# Patient Record
Sex: Female | Born: 1935 | Race: Black or African American | Hispanic: No | Smoking: Never smoker
Health system: Southern US, Community
[De-identification: ages and names within clinical notes are randomized; demographics above are authoritative.]

## PROBLEM LIST (undated history)

## (undated) DIAGNOSIS — I10 Essential (primary) hypertension: Secondary | ICD-10-CM

## (undated) DIAGNOSIS — E079 Disorder of thyroid, unspecified: Secondary | ICD-10-CM

---

## 2001-07-19 ENCOUNTER — Other Ambulatory Visit: Admission: RE | Admit: 2001-07-19 | Discharge: 2001-07-19 | Payer: Self-pay | Admitting: Family Medicine

## 2001-07-25 ENCOUNTER — Encounter: Payer: Self-pay | Admitting: Family Medicine

## 2001-07-25 ENCOUNTER — Ambulatory Visit (HOSPITAL_COMMUNITY): Admission: RE | Admit: 2001-07-25 | Discharge: 2001-07-25 | Payer: Self-pay | Admitting: Family Medicine

## 2001-09-30 ENCOUNTER — Ambulatory Visit (HOSPITAL_COMMUNITY): Admission: RE | Admit: 2001-09-30 | Discharge: 2001-09-30 | Payer: Self-pay | Admitting: Family Medicine

## 2001-09-30 ENCOUNTER — Encounter: Payer: Self-pay | Admitting: Family Medicine

## 2002-10-03 ENCOUNTER — Ambulatory Visit (HOSPITAL_COMMUNITY): Admission: RE | Admit: 2002-10-03 | Discharge: 2002-10-03 | Payer: Self-pay | Admitting: Family Medicine

## 2002-10-03 ENCOUNTER — Encounter: Payer: Self-pay | Admitting: Family Medicine

## 2003-01-12 ENCOUNTER — Encounter: Payer: Self-pay | Admitting: Podiatry

## 2003-01-15 ENCOUNTER — Ambulatory Visit (HOSPITAL_COMMUNITY): Admission: RE | Admit: 2003-01-15 | Discharge: 2003-01-15 | Payer: Self-pay | Admitting: Podiatry

## 2003-10-23 ENCOUNTER — Ambulatory Visit (HOSPITAL_COMMUNITY): Admission: RE | Admit: 2003-10-23 | Discharge: 2003-10-23 | Payer: Self-pay | Admitting: Family Medicine

## 2004-12-18 ENCOUNTER — Ambulatory Visit (HOSPITAL_COMMUNITY): Admission: RE | Admit: 2004-12-18 | Discharge: 2004-12-18 | Payer: Self-pay | Admitting: Family Medicine

## 2005-01-16 ENCOUNTER — Other Ambulatory Visit: Admission: RE | Admit: 2005-01-16 | Discharge: 2005-01-16 | Payer: Self-pay | Admitting: Obstetrics & Gynecology

## 2005-01-21 ENCOUNTER — Encounter: Payer: Self-pay | Admitting: Internal Medicine

## 2005-01-21 ENCOUNTER — Ambulatory Visit (HOSPITAL_COMMUNITY): Admission: RE | Admit: 2005-01-21 | Discharge: 2005-01-21 | Payer: Self-pay | Admitting: Internal Medicine

## 2005-01-21 ENCOUNTER — Ambulatory Visit: Payer: Self-pay | Admitting: Internal Medicine

## 2006-01-21 ENCOUNTER — Ambulatory Visit (HOSPITAL_COMMUNITY): Admission: RE | Admit: 2006-01-21 | Discharge: 2006-01-21 | Payer: Self-pay | Admitting: Family Medicine

## 2007-03-08 ENCOUNTER — Ambulatory Visit (HOSPITAL_COMMUNITY): Admission: RE | Admit: 2007-03-08 | Discharge: 2007-03-08 | Payer: Self-pay | Admitting: Family Medicine

## 2007-08-01 ENCOUNTER — Ambulatory Visit (HOSPITAL_COMMUNITY): Admission: RE | Admit: 2007-08-01 | Discharge: 2007-08-01 | Payer: Self-pay | Admitting: Family Medicine

## 2008-03-20 ENCOUNTER — Ambulatory Visit (HOSPITAL_COMMUNITY): Admission: RE | Admit: 2008-03-20 | Discharge: 2008-03-20 | Payer: Self-pay | Admitting: Family Medicine

## 2008-07-06 HISTORY — PX: CHOLECYSTECTOMY: SHX55

## 2009-01-24 ENCOUNTER — Encounter (INDEPENDENT_AMBULATORY_CARE_PROVIDER_SITE_OTHER): Payer: Self-pay | Admitting: General Surgery

## 2009-01-24 ENCOUNTER — Other Ambulatory Visit: Admission: RE | Admit: 2009-01-24 | Discharge: 2009-01-24 | Payer: Self-pay | Admitting: General Surgery

## 2009-02-11 ENCOUNTER — Ambulatory Visit (HOSPITAL_COMMUNITY): Admission: RE | Admit: 2009-02-11 | Discharge: 2009-02-11 | Payer: Self-pay | Admitting: Family Medicine

## 2009-02-14 ENCOUNTER — Other Ambulatory Visit: Admission: RE | Admit: 2009-02-14 | Discharge: 2009-02-14 | Payer: Self-pay | Admitting: General Surgery

## 2009-03-22 ENCOUNTER — Ambulatory Visit (HOSPITAL_COMMUNITY): Admission: RE | Admit: 2009-03-22 | Discharge: 2009-03-22 | Payer: Self-pay | Admitting: Family Medicine

## 2009-04-11 ENCOUNTER — Ambulatory Visit (HOSPITAL_COMMUNITY): Admission: RE | Admit: 2009-04-11 | Discharge: 2009-04-11 | Payer: Self-pay | Admitting: Family Medicine

## 2009-10-14 ENCOUNTER — Ambulatory Visit (HOSPITAL_COMMUNITY): Admission: RE | Admit: 2009-10-14 | Discharge: 2009-10-14 | Payer: Self-pay | Admitting: Family Medicine

## 2009-10-29 ENCOUNTER — Ambulatory Visit (HOSPITAL_COMMUNITY): Admission: RE | Admit: 2009-10-29 | Discharge: 2009-10-29 | Payer: Self-pay | Admitting: General Surgery

## 2009-11-20 ENCOUNTER — Encounter (INDEPENDENT_AMBULATORY_CARE_PROVIDER_SITE_OTHER): Payer: Self-pay | Admitting: General Surgery

## 2009-11-20 ENCOUNTER — Ambulatory Visit (HOSPITAL_COMMUNITY): Admission: RE | Admit: 2009-11-20 | Discharge: 2009-11-21 | Payer: Self-pay | Admitting: General Surgery

## 2010-04-24 ENCOUNTER — Ambulatory Visit (HOSPITAL_COMMUNITY): Admission: RE | Admit: 2010-04-24 | Discharge: 2010-04-24 | Payer: Self-pay | Admitting: Family Medicine

## 2010-07-25 IMAGING — CR DG PELVIS 1-2V
1 series · 1 of 1 positions shown · non-contrast
Comparison: None.

Addendum Begins

Note that there are changes of diffuse idiopathic skeletal
hyperostosis (DISH) involving the inferior aspects of the SI joints
with bony bridging appreciated.
Addendum Ends
CLINICAL DATA: Low back and upper pelvis pain.
PELVIS - 1-2 VIEW

[view not recorded]
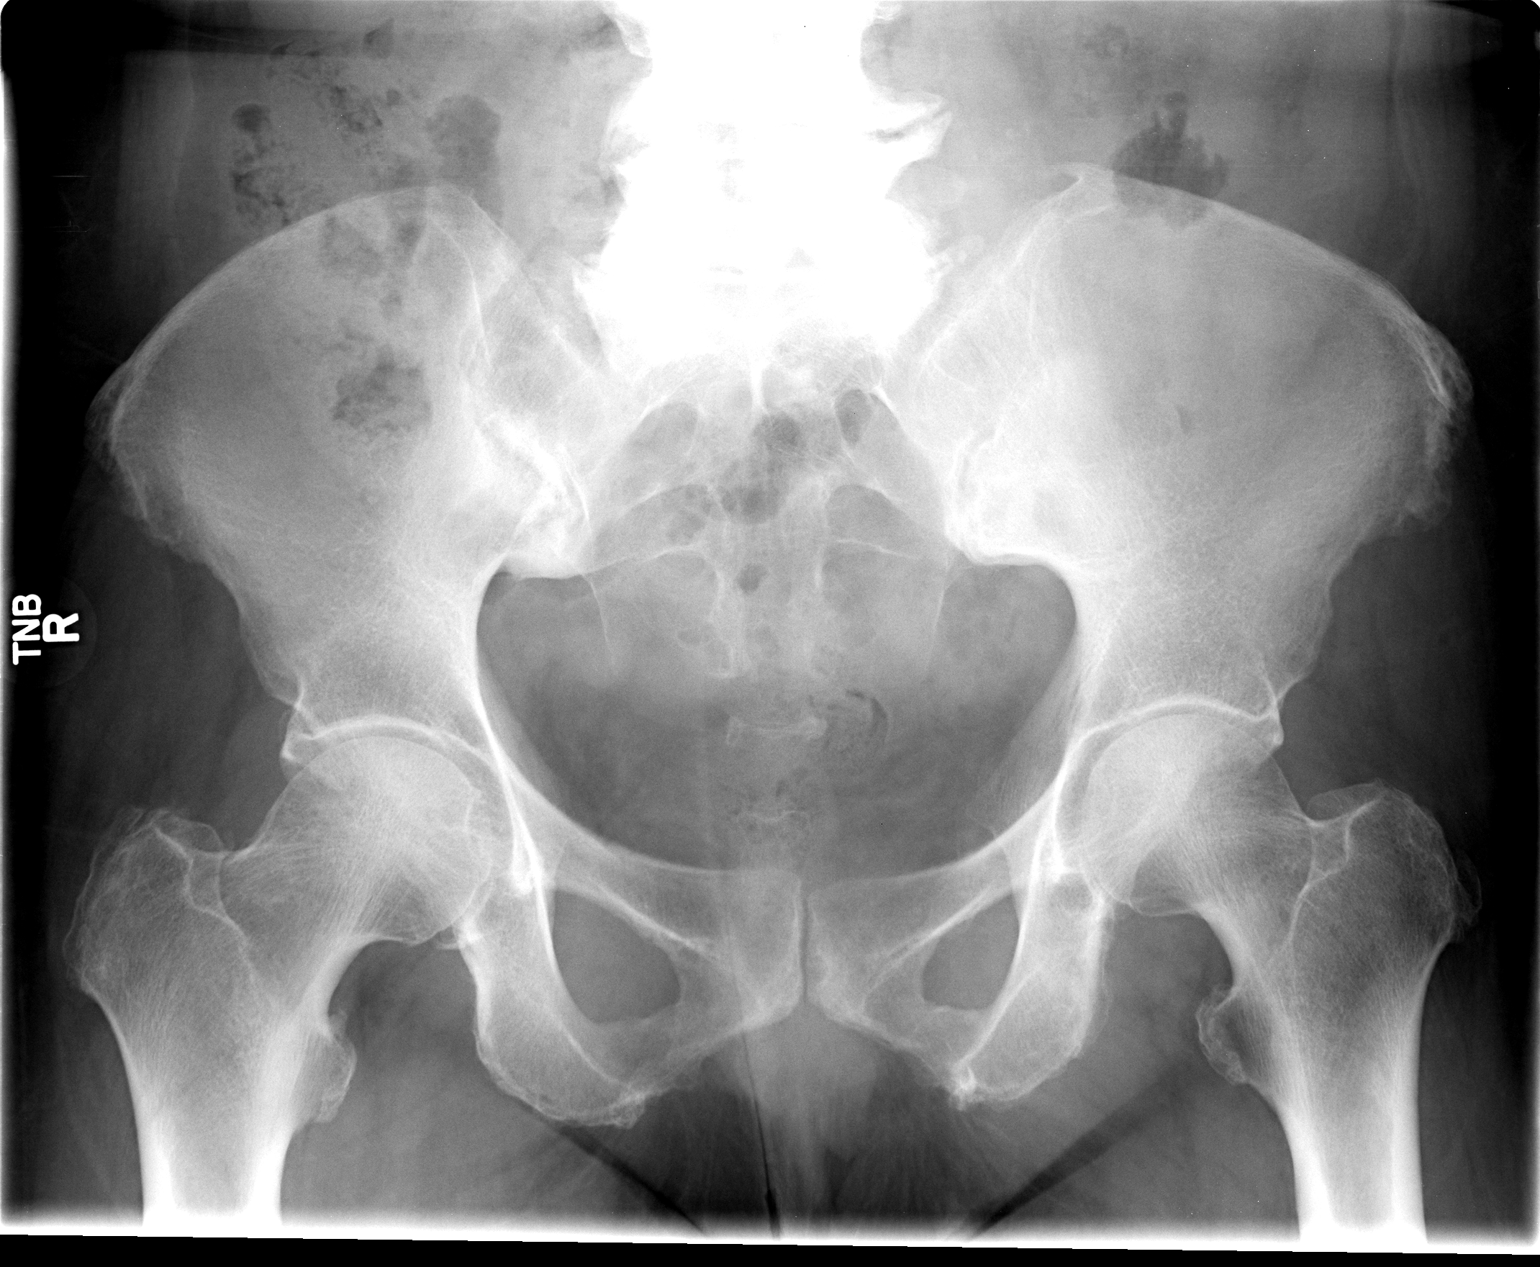

[1 of 1 positions shown; findings below may reference images not displayed]

FINDINGS: Degenerative changes of the SI joints, right greater than
left.  Hip joints unremarkable.  Degenerative changes lower lumbar
spine.
IMPRESSION: Degenerative arthrosis of the sacroiliac joints.  Degenerative
spondylosis.

## 2010-08-09 IMAGING — US US ABDOMEN COMPLETE
1 series · 14 of 25 positions shown · non-contrast
Comparison: None.

CLINICAL DATA: Right upper quadrant pain.  Cholelithiasis.

ABDOMINAL ULTRASOUND COMPLETE

[Series 1: us abdomen complete · 0.26mm/px · 14 of 82 slices shown]
[im 1/82]
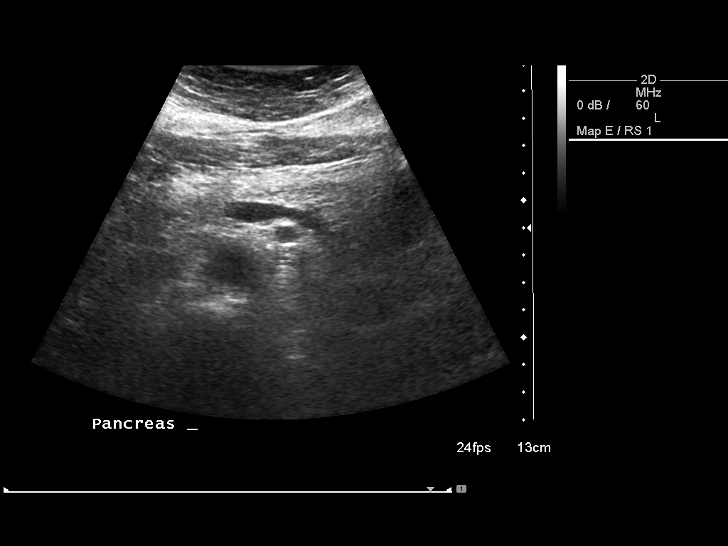
[im 7/82]
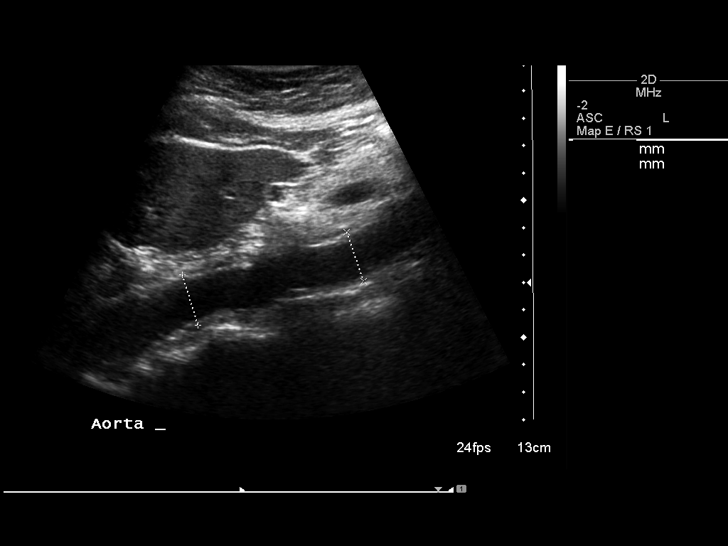
[im 14/82]
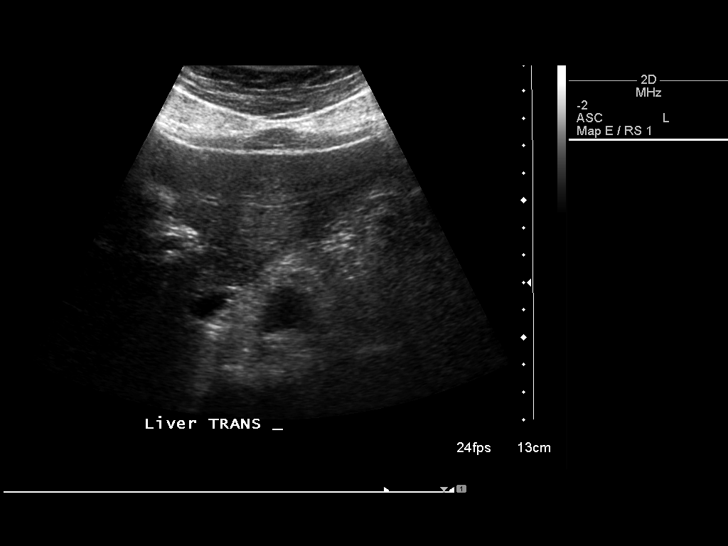
[im 21/82]
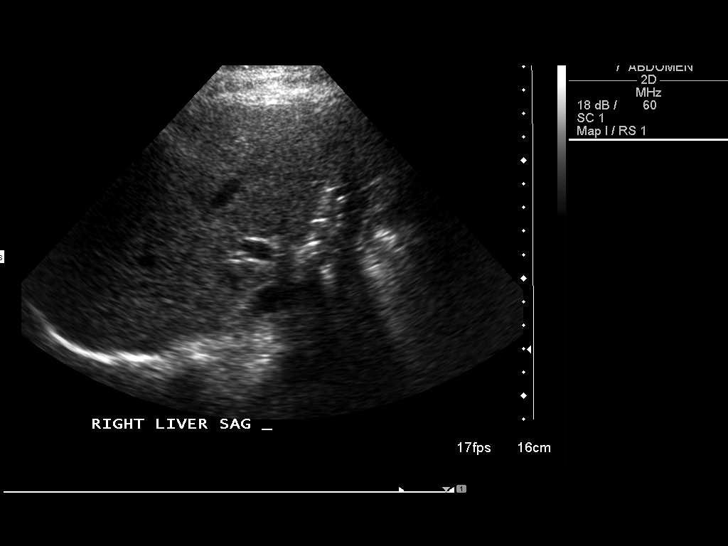
[im 28/82]
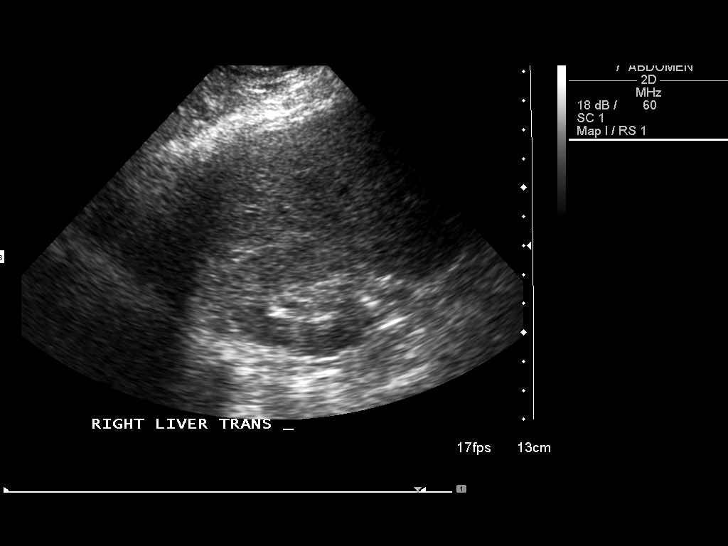
[im 31/82]
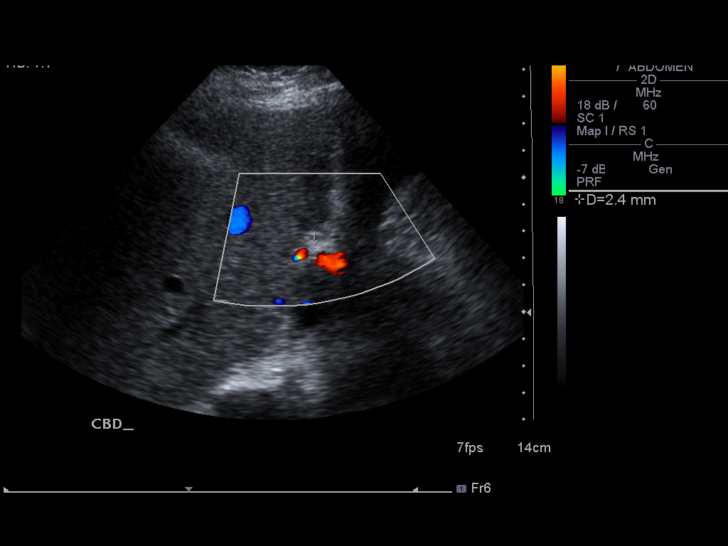
[im 38/82]
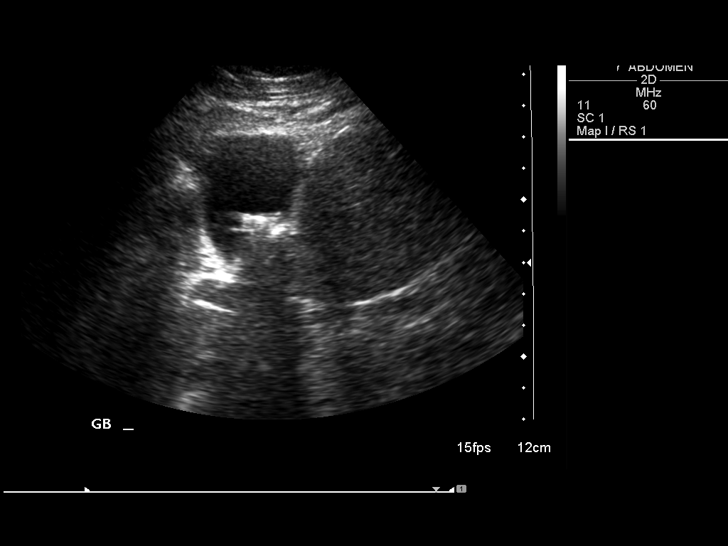
[im 44/82]
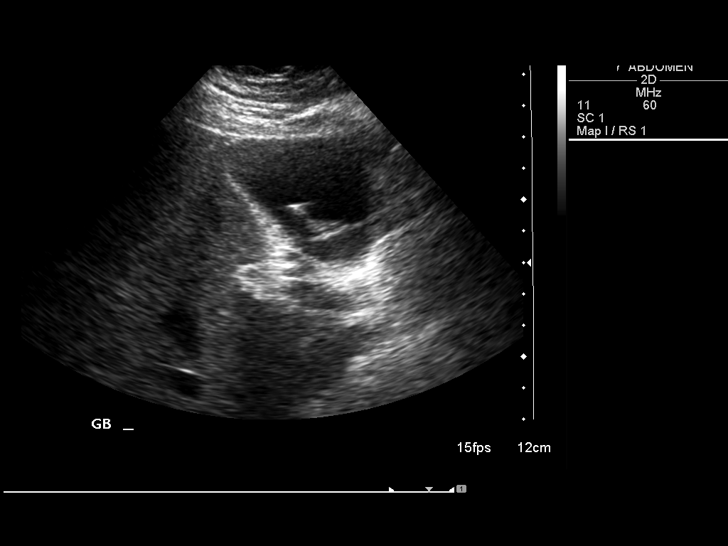
[im 51/82]
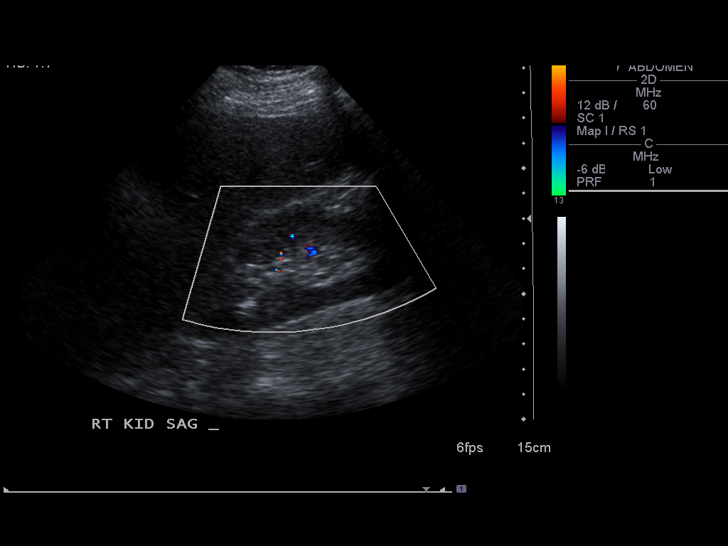
[im 55/82]
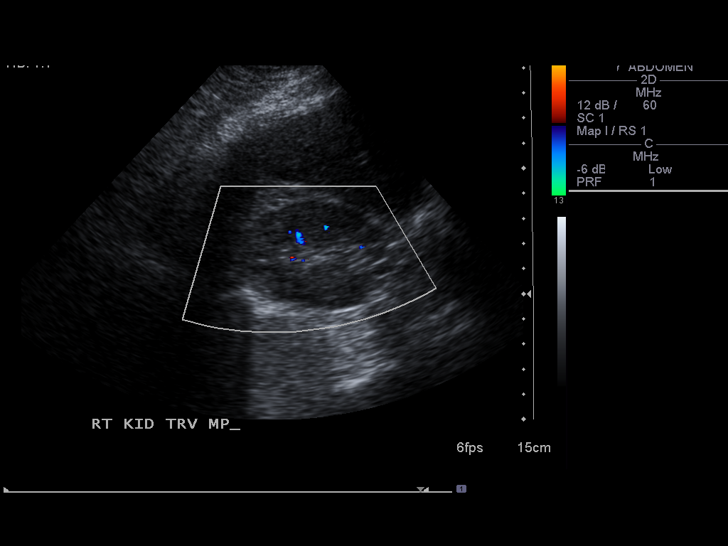
[im 61/82]
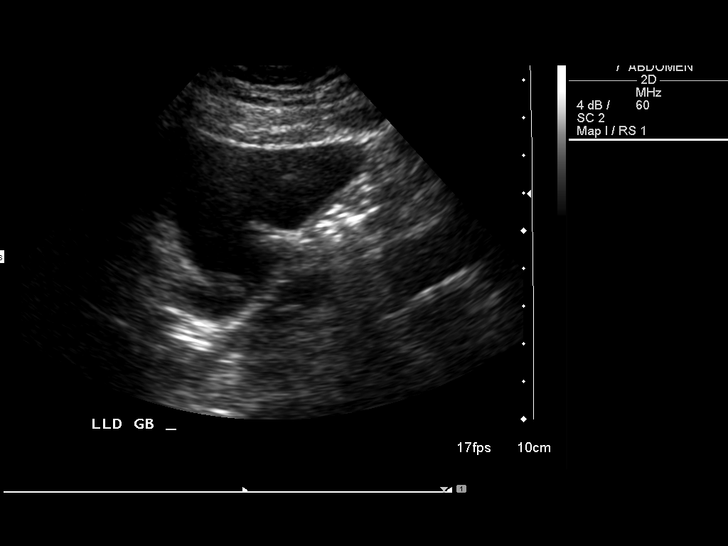
[im 68/82]
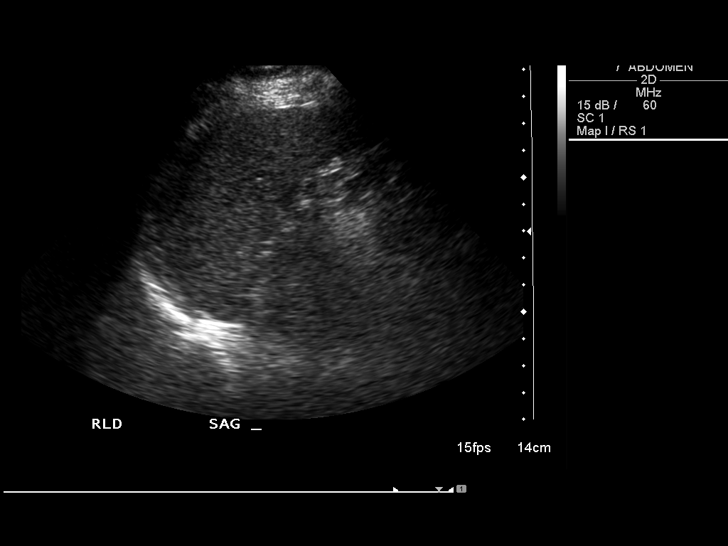
[im 75/82]
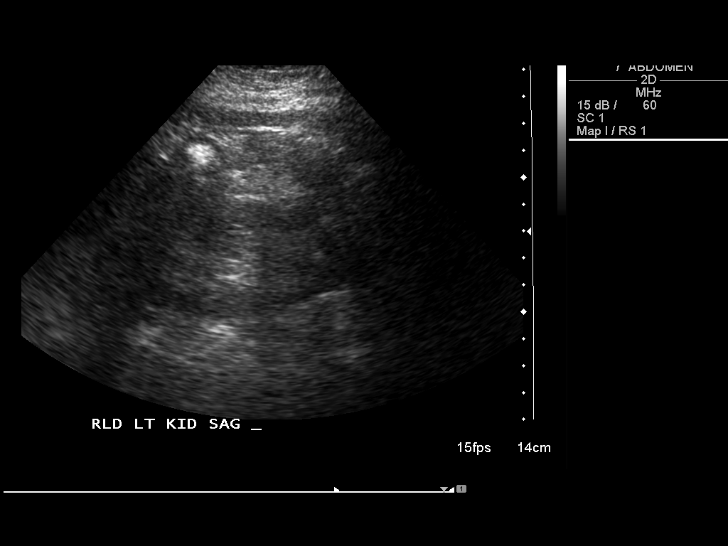
[im 82/82]
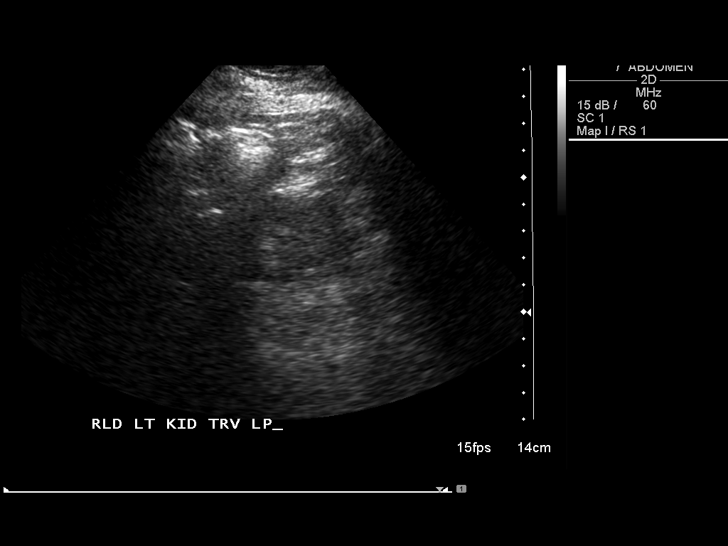

[14 of 25 positions shown; findings below may reference images not displayed]

FINDINGS: Gallbladder:  Multiple gallstones are noted however there is no
evidence of gallbladder dilatation or wall thickening.

Common Bile Duct:  Within normal limits in caliber. Measures 2-3 mm
in diameter.

Liver:  No focal mass lesion identified.  Within normal limits in
parenchymal echogenicity.

IVC:  Appears normal.

Pancreas:  No abnormality identified, although entire pancreas
cannot be visualized by ultrasound.

Spleen:  Within normal limits in size and echotexture.

Right kidney:  Normal in size and parenchymal echogenicity.  No
evidence of mass or hydronephrosis.

Left kidney:  Visualization somewhat limited by body habitus and
bowel gas, however no evidence of mass or hydronephrosis.

Abdominal Aorta:  No aneurysm identified.
IMPRESSION: Cholelithiasis.  No sonographic evidence of acute cholecystitis or
biliary dilatation.

## 2010-09-22 LAB — CBC
MCHC: 34 g/dL (ref 30.0–36.0)
RDW: 13.8 % (ref 11.5–15.5)
WBC: 7 10*3/uL (ref 4.0–10.5)
WBC: 8.7 10*3/uL (ref 4.0–10.5)

## 2010-09-22 LAB — HEPATIC FUNCTION PANEL
ALT: 16 U/L (ref 0–35)
ALT: 21 U/L (ref 0–35)
AST: 22 U/L (ref 0–37)
AST: 24 U/L (ref 0–37)
Albumin: 4 g/dL (ref 3.5–5.2)
Bilirubin, Direct: 0.2 mg/dL (ref 0.0–0.3)
Total Bilirubin: 0.6 mg/dL (ref 0.3–1.2)

## 2010-09-22 LAB — DIFFERENTIAL
Basophils Absolute: 0 10*3/uL (ref 0.0–0.1)
Basophils Absolute: 0 10*3/uL (ref 0.0–0.1)
Basophils Relative: 0 % (ref 0–1)
Basophils Relative: 0 % (ref 0–1)
Eosinophils Absolute: 0.1 10*3/uL (ref 0.0–0.7)
Eosinophils Absolute: 0.2 10*3/uL (ref 0.0–0.7)
Lymphocytes Relative: 17 % (ref 12–46)
Lymphs Abs: 1.1 10*3/uL (ref 0.7–4.0)
Monocytes Absolute: 0.3 10*3/uL (ref 0.1–1.0)
Monocytes Relative: 5 % (ref 3–12)
Monocytes Relative: 7 % (ref 3–12)
Neutro Abs: 5.4 10*3/uL (ref 1.7–7.7)
Neutro Abs: 6.5 10*3/uL (ref 1.7–7.7)
Neutrophils Relative %: 75 % (ref 43–77)
Neutrophils Relative %: 76 % (ref 43–77)

## 2010-09-22 LAB — MRSA PCR SCREENING: MRSA by PCR: NEGATIVE

## 2010-09-22 LAB — BASIC METABOLIC PANEL
CO2: 28 mEq/L (ref 19–32)
CO2: 32 mEq/L (ref 19–32)
Chloride: 103 mEq/L (ref 96–112)
Creatinine, Ser: 1.03 mg/dL (ref 0.4–1.2)
GFR calc Af Amer: >60 mL/min (ref >60)
Glucose, Bld: 87 mg/dL (ref 70–99)
Glucose, Bld: 97 mg/dL (ref 70–99)
Potassium: 4 mEq/L (ref 3.5–5.1)
Potassium: 4.5 mEq/L (ref 3.5–5.1)
Sodium: 139 mEq/L (ref 135–145)

## 2010-11-18 NOTE — Op Note (Signed)
NAME:  Paula Norton, Paula Norton                ACCOUNT NO.:  [id number]   MEDICAL RECORD NO.:  [id number]          PATIENT TYPE:  AMB   LOCATION:  DAY                           FACILITY:  [facility]   PHYSICIAN:  Barbaraann Barthel, M.D. DATE OF BIRTH:  [birth year]   DATE OF PROCEDURE:  01/24/2009  DATE OF DISCHARGE:                               OPERATIVE REPORT   HISTORY OF PRESENT MEDICAL PROBLEM:  In essence, she had a slightly  raised lesion at the inferior portion of the intragluteal space above  the anus and not perianal, but more at the area of the coccyx in the  midline.  This had been present for at least 5 months and that had been  treated with Bactroban for 5 months by Dr. Renard Matter.  This did not  improve and she was referred to my office.   The patient's buttocks were separated and the area was prepped with  Betadine solution and under local anesthesia 1% Xylocaine without  epinephrine.  I anesthetized an area around this and excised this lesion  elliptically and sent this in formalin.  The base of the lesion was then  cauterized and the wound was dressed with a Xeroform dressing and 4x4s.  Followup wound care was prescribed and we will follow up with this  patient in our office.  This lesion will be sent for analysis at [facility].      Barbaraann Barthel, M.D.  Electronically Signed     WB/MEDQ  D:  01/24/2009  T:  01/25/2009  Job:  [id number]   cc:   Angus G. Renard Matter, MD  Fax: [phone]   [facility]OLOGY [facility] AT [facility]

## 2010-11-21 NOTE — Op Note (Signed)
NAME:  Paula Norton, Paula Norton                ACCOUNT NO.:  [id number]   MEDICAL RECORD NO.:  [id number]          PATIENT TYPE:  AMB   LOCATION:  DAY                           FACILITY:  [facility]   PHYSICIAN:  R. Roetta Sessions, M.D. DATE OF BIRTH:  1930-01-31   DATE OF PROCEDURE:  01/21/2005  DATE OF DISCHARGE:                                 OPERATIVE REPORT   PROCEDURE:  Colonoscopy with snare polypectomy.   INDICATIONS FOR PROCEDURE:  The patient is a pleasant 77 year old African-  American female who presents for colorectal screening.  She has never had a  colonoscopy or any other imaging study of her lower GI tract.  She is devoid  of lower GI tract symptoms and there is no family history of colorectal  cancer.  Colonoscopy is now being done and its approach has been discussed  with the patient at length and the potential risks, benefits, and  alternatives have been reviewed and questions answered.  She is agreeable to  proceed and please see documentation in the medical record.   PROCEDURE NOTE:  O2 saturation, pulse, blood pressure, pulses and  respirations were monitored throughout the entire procedure.   CONSCIOUS SEDATION:  Versed 3 mg IV and Demerol 75 mg IV in divided doses.   INSTRUMENT:  Olympus video system.   FINDINGS:  Digital rectal examination revealed no abnormalities.   ENDOSCOPIC FINDINGS:  The prep was adequate.   RECTUM:  Examination of the rectal mucosa, including a retroflex view of the  anal verge revealed no abnormalities.   COLON:  The colonic mucosa was surveyed from the rectosigmoid junction  through the left, transverse and right colon to the appendiceal orifice,  ileocecal valve and cecum.  These structures were well seen and photographed  for the record.  From this level, the scope was slowly withdrawn and all  previously mentioned mucosal surfaces were again seen.  The patient was  noted to have sigmoid diverticula and a 6-mm pedunculated polyp in the mid  ascending colon.  The remainder of the colonic mucosa appeared normal.  The  polyp in the mid ascending colon was removed with cold snare technique and  recovered through the scope.  The patient tolerated the procedure well and  was reacted.   ENDOSCOPIC IMPRESSION:  1.  Normal rectum.  2.  Sigmoid diverticula.  3.  Pedunculated polyp, mid right colon, removed with snare as described      above.  4.  Remainder of colonic mucosa appeared normal.   RECOMMENDATIONS:  1.  No aspirin or __________ medications for the next 10 days.  2.  Diverticulosis literature provided to Ms. Daphine Deutscher.  3.  Follow up on pathology.  4.  Further recommendations to follow.       RMR/MEDQ  D:  01/21/2005  T:  01/21/2005  Job:  [id number]   cc:   Angus G. Renard Matter, MD  [address]  [place]  [place] [address]  Fax: [phone]

## 2010-11-21 NOTE — Op Note (Signed)
NAME:  Paula Norton, Paula Norton                          ACCOUNT NO.:  [id number]   MEDICAL RECORD NO.:  [id number]                   PATIENT TYPE:  AMB   LOCATION:  DAY                                  FACILITY:  [facility]   PHYSICIAN:  Oley Balm. Pricilla Holm, D.P.M.             DATE OF BIRTH:  December 17, 1929   DATE OF PROCEDURE:  01/15/2003  DATE OF DISCHARGE:                                 OPERATIVE REPORT   SURGEON:  Oley Balm. Pricilla Holm, D.P.M.   ANESTHESIA:  Local with standby.   PREOPERATIVE DIAGNOSIS:  Hallux valgus deformity with contracted second,  third, forth, and fifth toes of the left foot.   POSTOPERATIVE DIAGNOSIS:  Hallux valgus deformity with contracted second,  third, forth, and fifth toes of the left foot.   PROCEDURE:  Keller-Austin bunionectomy with IP fusion, second, third, forth,  and fifth toes.   INDICATIONS FOR PROCEDURE:  Longstanding history of pain unrelieved by  conservative care.   DESCRIPTION OF PROCEDURE:  The patient was brought to the operating room and  placed on the operating table in the supine position.  The patient's lower  left foot and leg was then prepped and draped in the usual aseptic manner.  Then, with an ankle tourniquet placed, well-padded to prevent contusion,  elevated to 250 mmHg.  After exsanguination of the left foot, the following  surgical procedure was then performed under monitored anesthesia care.   KELLER-AUSTIN BUNIONECTOMY, LEFT FOOT:  Attention was directed to the medial  aspect of the left foot at the level of the first MTP where a curvilinear  incision was made.  The incision was widened and deepened via sharp and  blunt dissection, being sure to identify and retract all vital structures.  A capsular incision was then made.  It was noted that the patient had  multiple tophaceous deposits throughout the capsule and throughout the first  metatarsal head.  Then utilizing a Zimmer oscillating saw, the medial limits  on the medial aspect of  the first metatarsal head was resected.  Osteotomy  was performed and fixated.  Then the base of the phalanx was then resected,  repositioned, the capsule was interposed into the area, and a [employer] had been performed.  The wound was lavaged with copious amounts  of sterile saline.  The subcutaneous tissue was approximated with sutures of  4-0 Dexon.  The skin was approximated utilizing running subcuticular suture  of 4-0 Dexon.   IP FUSION SECOND TOE, LEFT FOOT:  Attention was directed to the dorsal  aspect of the second toe where two semi-elliptical transverse skin incisions  were made.  The incision was widened and deepened via sharp and blunt  dissection, being sure to identify and retract all vital structures.  The  capsular incision was then made, and the phalanx was freed from the  surrounding soft tissue attachments dorsally, medially, laterally, and  plantarly.  Then  utilizing a Zimmer oscillating saw, the head of the phalanx  was resected.  After resection of the head of the phalanx, the toe was put  in a more anatomical position and fixated utilizing a 0.045 K-wire.  After  fixation, it was noted that the toe was straight and in proper position.  The wound was lavaged with copious amounts of sterile saline, and the  capsule and subcutaneous tissues were approximated utilizing suture of 4-0  Dexon.  The skin was approximated utilizing a horizontal mattress suture of  4-0 Prolene.   IP ARTHROPLASTY WITH IP FUSION, THIRD TOE, LEFT FOOT:  A similar procedure  as described on the fourth toe was performed on the third toe, with no  addition or deletions.   ARTHROPLASTY WITH IP FUSION, FORTH TOE, LEFT FOOT:  A similar procedure as  described on the second toe was performed on the fourth digit, with no  addition or deletions.   ARTHROPLASTY, FIFTH TOE, LEFT FOOT:  A similar procedure as described on the  first toe was performed on the fifth toe, with the exception that  no  fixation was used.   All surgical sites were then infiltrated with approximately one-eighth cc of  dexamethasone phosphate.  A mild compressive bandage consisting of Betadine-  soaked Adaptic, sterile 4 x 4s, and sterile Kling was applied.  The patient  tolerated the procedure well and left the operating room in apparent good  condition with vital signs stable in the recovery room.                                                Oley Balm Pricilla Holm, D.P.M.    DBT/MEDQ  D:  01/15/2003  T:  01/15/2003  Job:  [id number]

## 2010-11-21 NOTE — H&P (Signed)
   NAME:  Paula Norton, Paula Norton                          ACCOUNT NO.:  [id number]   MEDICAL RECORD NO.:  [id number]                   PATIENT TYPE:  AMB   LOCATION:  DAY                                  FACILITY:  [facility]   PHYSICIAN:  Oley Balm. Pricilla Holm, D.P.M.             DATE OF BIRTH:  [birth year]   DATE OF ADMISSION:  01/15/2003  DATE OF DISCHARGE:                                HISTORY & PHYSICAL   HISTORY OF PRESENT ILLNESS:  Ms. Kubin is a 77 year old black female who  presented to the office with chief complaint of rheumatoid arthritis and  multiple deformities of her left foot. Previously, we had corrected a bunion  deformity and done a Hoffman procedure on her right foot. The patient  requests similar procedures done on her left foot. The patient has had a  multiple year history of rheumatoid arthritis with associated deformities.   PAST SURGICAL HISTORY:  Right shoulder was dislocated and additionally, she  had previous foot surgery.   PAST MEDICAL HISTORY:  No transfusion hepatitis risks. Past medical history  of asthma, arthritis, and hypertension.   MEDICATIONS:  Mescaline, Zocor, Clarinex, hydrochlorothiazide, Avapro and  Flonase.   ALLERGIES:  No known drug allergies.   PHYSICAL EXAMINATION:  EXTREMITIES: Lower extremity examination reveals  palpable pedal pulses of DP and PT with spontaneous capillary filling time.  NEUROLOGIC: Examination in essentially within normal limits.  MUSCULOSKELETAL: Examination reveals that the patient has noted hallux  valgus deformity of the left foot with lateral deviation, hallux consists  with hallux valgus deformity. There is also noted significant decreased  range of motion of the first MTP. The patient has dislocation of the left  MTP's with hammer toe deformities 2 through 5 bilaterally.   ASSESSMENT:  Hallux valgus with associated rheumatoid deformities, now  including dislocation of the lesser MTP's.   PLAN:  The patient to undergo  Hoffman procedure with Lorenz Coaster bunionectomy.  Reviewed the procedure with the patient including complications of seizure,  risk of infection, bone infection, postoperative pain, swelling, etc. The  patient seems to understand the same and surgery will be scheduled for January 15, 2003.                                                Oley Balm Pricilla Holm, D.P.M.    DBT/MEDQ  D:  01/14/2003  T:  01/15/2003  Job:  [id number]

## 2011-03-27 ENCOUNTER — Other Ambulatory Visit (HOSPITAL_COMMUNITY): Payer: Self-pay | Admitting: Family Medicine

## 2011-03-27 DIAGNOSIS — Z139 Encounter for screening, unspecified: Secondary | ICD-10-CM

## 2011-04-01 ENCOUNTER — Other Ambulatory Visit (HOSPITAL_COMMUNITY): Payer: Self-pay

## 2011-04-06 ENCOUNTER — Ambulatory Visit (HOSPITAL_COMMUNITY)
Admission: RE | Admit: 2011-04-06 | Discharge: 2011-04-06 | Disposition: A | Discharge status: Home / Self Care | Payer: Medicare Other | Source: Ambulatory Visit | Attending: Family Medicine | Admitting: Family Medicine

## 2011-04-06 DIAGNOSIS — M949 Disorder of cartilage, unspecified: Secondary | ICD-10-CM | POA: Insufficient documentation

## 2011-04-06 DIAGNOSIS — Z780 Asymptomatic menopausal state: Secondary | ICD-10-CM | POA: Insufficient documentation

## 2011-04-06 DIAGNOSIS — M899 Disorder of bone, unspecified: Secondary | ICD-10-CM | POA: Insufficient documentation

## 2011-04-27 ENCOUNTER — Ambulatory Visit (HOSPITAL_COMMUNITY)
Admission: RE | Admit: 2011-04-27 | Discharge: 2011-04-27 | Disposition: A | Discharge status: Home / Self Care | Payer: Medicare Other | Source: Ambulatory Visit | Attending: Family Medicine | Admitting: Family Medicine

## 2011-04-27 DIAGNOSIS — Z139 Encounter for screening, unspecified: Secondary | ICD-10-CM

## 2011-04-27 DIAGNOSIS — Z1231 Encounter for screening mammogram for malignant neoplasm of breast: Secondary | ICD-10-CM | POA: Insufficient documentation

## 2012-05-02 ENCOUNTER — Other Ambulatory Visit (HOSPITAL_COMMUNITY): Payer: Self-pay | Admitting: Family Medicine

## 2012-05-02 DIAGNOSIS — Z139 Encounter for screening, unspecified: Secondary | ICD-10-CM

## 2012-05-09 ENCOUNTER — Ambulatory Visit (HOSPITAL_COMMUNITY)
Admission: RE | Admit: 2012-05-09 | Discharge: 2012-05-09 | Disposition: A | Discharge status: Home / Self Care | Payer: Medicare Other | Source: Ambulatory Visit | Attending: Family Medicine | Admitting: Family Medicine

## 2012-05-09 DIAGNOSIS — Z1231 Encounter for screening mammogram for malignant neoplasm of breast: Secondary | ICD-10-CM | POA: Insufficient documentation

## 2012-05-09 DIAGNOSIS — Z139 Encounter for screening, unspecified: Secondary | ICD-10-CM

## 2012-05-16 ENCOUNTER — Other Ambulatory Visit: Payer: Self-pay | Admitting: Family Medicine

## 2012-05-16 DIAGNOSIS — R928 Other abnormal and inconclusive findings on diagnostic imaging of breast: Secondary | ICD-10-CM

## 2012-05-25 ENCOUNTER — Ambulatory Visit (HOSPITAL_COMMUNITY)
Admission: RE | Admit: 2012-05-25 | Discharge: 2012-05-25 | Disposition: A | Discharge status: Home / Self Care | Payer: Medicare Other | Source: Ambulatory Visit | Attending: Family Medicine | Admitting: Family Medicine

## 2012-05-25 DIAGNOSIS — R928 Other abnormal and inconclusive findings on diagnostic imaging of breast: Secondary | ICD-10-CM

## 2012-06-01 ENCOUNTER — Encounter (HOSPITAL_COMMUNITY): Payer: Medicare Other

## 2012-06-08 ENCOUNTER — Ambulatory Visit (HOSPITAL_COMMUNITY)
Admission: RE | Admit: 2012-06-08 | Discharge: 2012-06-08 | Disposition: A | Discharge status: Home / Self Care | Payer: Medicare Other | Source: Ambulatory Visit | Attending: Family Medicine | Admitting: Family Medicine

## 2012-06-08 DIAGNOSIS — R928 Other abnormal and inconclusive findings on diagnostic imaging of breast: Secondary | ICD-10-CM | POA: Insufficient documentation

## 2012-10-11 ENCOUNTER — Ambulatory Visit (HOSPITAL_COMMUNITY)
Admission: RE | Admit: 2012-10-11 | Discharge: 2012-10-11 | Disposition: A | Discharge status: Home / Self Care | Payer: Medicare Other | Source: Ambulatory Visit | Attending: Family Medicine | Admitting: Family Medicine

## 2012-10-11 ENCOUNTER — Other Ambulatory Visit (HOSPITAL_COMMUNITY): Payer: Self-pay | Admitting: Family Medicine

## 2012-10-11 DIAGNOSIS — M25661 Stiffness of right knee, not elsewhere classified: Secondary | ICD-10-CM

## 2012-10-11 DIAGNOSIS — M25662 Stiffness of left knee, not elsewhere classified: Secondary | ICD-10-CM

## 2012-10-11 DIAGNOSIS — M25561 Pain in right knee: Secondary | ICD-10-CM

## 2012-10-11 DIAGNOSIS — M25562 Pain in left knee: Secondary | ICD-10-CM

## 2012-10-11 DIAGNOSIS — M25569 Pain in unspecified knee: Secondary | ICD-10-CM | POA: Insufficient documentation

## 2012-11-24 ENCOUNTER — Encounter: Payer: Medicare Other | Admitting: Vascular Surgery

## 2013-01-03 ENCOUNTER — Encounter: Payer: Self-pay | Admitting: Vascular Surgery

## 2013-01-04 ENCOUNTER — Ambulatory Visit (INDEPENDENT_AMBULATORY_CARE_PROVIDER_SITE_OTHER): Payer: Medicare Other | Admitting: Vascular Surgery

## 2013-01-04 ENCOUNTER — Encounter (INDEPENDENT_AMBULATORY_CARE_PROVIDER_SITE_OTHER): Payer: Medicare Other | Admitting: *Deleted

## 2013-01-04 ENCOUNTER — Encounter: Payer: Self-pay | Admitting: Vascular Surgery

## 2013-01-04 VITALS — BP 152/81 | HR 77 | Resp 16 | Ht 71.0 in | Wt 168.0 lb

## 2013-01-04 DIAGNOSIS — I83893 Varicose veins of bilateral lower extremities with other complications: Secondary | ICD-10-CM

## 2013-01-04 DIAGNOSIS — R208 Other disturbances of skin sensation: Secondary | ICD-10-CM

## 2013-01-04 DIAGNOSIS — R209 Unspecified disturbances of skin sensation: Secondary | ICD-10-CM

## 2013-01-04 DIAGNOSIS — R0989 Other specified symptoms and signs involving the circulatory and respiratory systems: Secondary | ICD-10-CM

## 2013-01-04 NOTE — Assessment & Plan Note (Signed)
Patient has some small varicose veins bilaterally and also hyperpigmentation and healed ulcers of both legs consistent with chronic venous insufficiency. She does have some reflux in the right saphenofemoral junction and proximal right greater saphenous vein. There is also some reflux in the left saphenofemoral junction. She's had previous sclerotherapy and likely has had vein stripped from both legs based on her duplex. I simply recommended intermittent leg elevation we have discussed the proper positioning for this. Also discussed the importance of compression therapy and she states that she are has compression stockings. She knows to keep the skin well-lubricated to prevent further ulcers and to stay as active as possible. I'll be happy to see her back at any time if any new vascular issue arise.

## 2013-01-04 NOTE — Assessment & Plan Note (Signed)
This patient has a right carotid bruit. This is associated with a carotid stenosis approximately 30% of the time. I have recommended a duplex of her carotids however she would prefer to discuss this with Dr. Renard Matter and in then we'll have him arrange for a carotid duplex scan. She is asymptomatic.

## 2013-01-04 NOTE — Progress Notes (Signed)
[facility] [place]  Patient name: Paula Norton MRN: [id number] DOB: 10/21/1929 Sex: female  REASON FOR CONSULT: bilateral lower extremity varicose veins.  HPI: Paula Norton is a 79 y.o. female who is had previous sclerotherapy in both lower extremities approximately 10 years ago. She had developed some recurrent varicose veins and wished to have these checked out. He states that they are asymptomatic. She denies any significant aching pain and heaviness or or throbbing pain in her lower extremities. She is unaware of any previous history of DVT or phlebitis. She states that she has not had any bleeding episodes associated with her varicose veins.  She states that she does intermittently wear compression stockings.  FAMILY HISTORY:  She is unaware of any history of premature cardiovascular disease to  Family History  Problem Relation Age of Onset  . Hypertension Mother   . Cancer Sister   . Heart disease Brother   . Cancer Brother    SOCIAL HISTORY: History  Substance Use Topics  . Smoking status: Never Smoker   . Smokeless tobacco: Never Used  . Alcohol Use: No   Not on File  Current Outpatient Prescriptions  Medication Sig Dispense Refill  . allopurinol (ZYLOPRIM) 100 MG tablet daily.      Marland Kitchen aspirin 81 MG tablet Take 81 mg by mouth daily.      . diazepam (VALIUM) 5 MG tablet Take 5 mg by mouth every 4 (four) hours as needed for anxiety.      Marland Kitchen DIOVAN 80 MG tablet daily.      Marland Kitchen levothyroxine (SYNTHROID, LEVOTHROID) 75 MCG tablet daily.      . niacin (NIASPAN) 500 MG CR tablet at bedtime.      . simvastatin (ZOCOR) 40 MG tablet daily.       No current facility-administered medications for this visit.   REVIEW OF SYSTEMS: Arly.Keller ] denotes positive finding; [  ] denotes negative finding  CARDIOVASCULAR:  [ ]  chest pain   [ ]  chest pressure   [ ]  palpitations   [ ]  orthopnea   [ ]  dyspnea on exertion   [ ]  claudication   [ ]  rest pain   [ ]  DVT   [ ]   phlebitis PULMONARY:   [ ]  productive cough   [ ]  asthma   [ ]  wheezing NEUROLOGIC:   [ ]  weakness  [ ]  paresthesias  [ ]  aphasia  [ ]  amaurosis  [ ]  dizziness HEMATOLOGIC:   [ ]  bleeding problems   [ ]  clotting disorders MUSCULOSKELETAL:  [ ]  joint pain   [ ]  joint swelling Arly.Keller ] leg swelling GASTROINTESTINAL: [ ]   blood in stool  [ ]   hematemesis GENITOURINARY:  [ ]   dysuria  [ ]   hematuria PSYCHIATRIC:  [ ]  history of major depression INTEGUMENTARY:  [ ]  rashes  [ ]  ulcers CONSTITUTIONAL:  [ ]  fever   [ ]  chills  PHYSICAL EXAM: Filed Vitals:   01/04/13 1104  BP: 152/81  Pulse: 77  Resp: 16  Height: 5\' 11"  (1.803 m)  Weight: 168 lb (76.204 kg)  SpO2: 100%   Body mass index is 23.44 kg/(m^2). GENERAL: The patient is a well-nourished female, in no acute distress. The vital signs are documented above. CARDIOVASCULAR: There is a regular rate and rhythm. She does have a right carotid bruit. He has palpable femoral pulses and pedal pulses bilaterally. PULMONARY: There is good air exchange bilaterally without wheezing or rales. ABDOMEN: Soft  and non-tender with normal pitched bowel sounds.  MUSCULOSKELETAL: There are no major deformities or cyanosis. NEUROLOGIC: No focal weakness or paresthesias are detected. SKIN: There are no ulcers or rashes noted. She has hyperpigmentation of both lower extremities. She has varicose veins of both lower extremities. She does have some healed venous ulcers the medial aspect of both legs. PSYCHIATRIC: The patient has a normal affect.  DATA:  I have independently interpreted her venous duplex scan. She has no evidence of DVT bilaterally. She does have some reflux the right saphenofemoral junction and proximal right greater saphenous vein. On the left side she has some saphenofemoral junction reflux the greater saphenous vein cannot be visualized otherwise.  MEDICAL ISSUES:  Right carotid bruit This patient has a right carotid bruit. This is associated  with a carotid stenosis approximately 30% of the time. I have recommended a duplex of her carotids however she would prefer to discuss this with Dr. Renard Matter and in then we'll have him arrange for a carotid duplex scan. She is asymptomatic.  Varicose veins of lower extremities with other complications Patient has some small varicose veins bilaterally and also hyperpigmentation and healed ulcers of both legs consistent with chronic venous insufficiency. She does have some reflux in the right saphenofemoral junction and proximal right greater saphenous vein. There is also some reflux in the left saphenofemoral junction. She's had previous sclerotherapy and likely has had vein stripped from both legs based on her duplex. I simply recommended intermittent leg elevation we have discussed the proper positioning for this. Also discussed the importance of compression therapy and she states that she are has compression stockings. She knows to keep the skin well-lubricated to prevent further ulcers and to stay as active as possible. I'll be happy to see her back at any time if any new vascular issue arise.   DICKSON,CHRISTOPHER S [facility] [place] [facility]: [phone]

## 2013-01-11 ENCOUNTER — Other Ambulatory Visit: Payer: Self-pay | Admitting: *Deleted

## 2013-01-11 DIAGNOSIS — I83893 Varicose veins of bilateral lower extremities with other complications: Secondary | ICD-10-CM

## 2013-05-03 ENCOUNTER — Telehealth: Payer: Self-pay | Admitting: *Deleted

## 2013-05-03 ENCOUNTER — Other Ambulatory Visit: Payer: Self-pay | Admitting: *Deleted

## 2013-05-03 ENCOUNTER — Ambulatory Visit (INDEPENDENT_AMBULATORY_CARE_PROVIDER_SITE_OTHER): Payer: Medicare Other | Admitting: Orthopedic Surgery

## 2013-05-03 ENCOUNTER — Encounter: Payer: Self-pay | Admitting: Orthopedic Surgery

## 2013-05-03 VITALS — BP 123/86 | Ht 71.0 in | Wt 161.0 lb

## 2013-05-03 DIAGNOSIS — R29898 Other symptoms and signs involving the musculoskeletal system: Secondary | ICD-10-CM | POA: Insufficient documentation

## 2013-05-03 DIAGNOSIS — R269 Unspecified abnormalities of gait and mobility: Secondary | ICD-10-CM

## 2013-05-03 NOTE — Patient Instructions (Signed)
Make referral to neurologist first available  For: bilateral leg weakness and Staggering

## 2013-05-03 NOTE — Progress Notes (Signed)
Patient ID: Paula Norton, female   DOB: 15-Oct-1929, 79 y.o.   MRN: [id number]  Chief Complaint  Patient presents with  . Knee Pain    Bilateral knee weakness and giving out, had fall about 10 days ago    This is an 79 year old female presents with weakness of her legs stumbling falling and gait disturbance which is present for the last 6 months unresponsive to injection and Celebrex  Bilateral knee x-rays have been done and show arthritis of the knees.  She has a review of systems positive for unexpected weight loss and blurred vision with stiffness of the musculoskeletal system and nervousness. Remaining systems were negative.  Her vital signs are stable  Her medical problems are hypertension and hypothyroidism  Current medications niacin valsartan and diazepam allopurinol levothyroxine and simvastatin  She has a family history of heart disease and arthritis Associates to notable for being widowed and she does not smoke or drink  BP 123/86  Ht 5\' 11"  (1.803 m)  Wt 161 lb (73.029 kg)  BMI 22.46 kg/m2  She is oriented x3 her speech is slow her mood is flat her gait is supported by a cane overall appearance is normal  Her legs show no atrophy she has 115 of flexion in the right knee and 125 on the left no pain with range of motion mild crepitance skin is intact. Lower ankle skin shows venous stasis chronic changes with discoloration and shiny appearance  Reflexes are 2+ and intact. No lymphadenopathy was noted.  Upper extremity exam  The right and left upper extremity:   Inspection and palpation revealed no abnormalities in the upper extremities.   Range of motion is full without contracture.  Motor exam is normal with grade 5 strength.  The joints are fully reduced without subluxation.  There is no atrophy or tremor and muscle tone is normal.  All joints are stable.  X-rays were done at the hospital again she has bilateral knee arthritis  Encounter Diagnoses  Name  Primary?  . Weakness of both legs Yes  . Gait disturbance      However dosing she needs surgery in her knee pain is not in any way significant infectious that she doesn't have knee pain  I think she should be evaluated by a neurologist to be worked up for gait disturbance and weakness of the legs.

## 2013-05-03 NOTE — Telephone Encounter (Addendum)
Referral and office notes faxed to Dr. [place]onquah. Awaiting appointment. 

## 2013-05-10 NOTE — Telephone Encounter (Signed)
Appointment with Dr. Gerilyn Pilgrim 06/26/13 at 11:00 am. Patient aware.

## 2013-07-23 ENCOUNTER — Encounter (HOSPITAL_COMMUNITY): Payer: Self-pay | Admitting: Emergency Medicine

## 2013-07-23 ENCOUNTER — Emergency Department (HOSPITAL_COMMUNITY)
Admission: EM | Admit: 2013-07-23 | Discharge: 2013-07-23 | Disposition: A | Discharge status: Home / Self Care | Payer: Medicare Other | Attending: Emergency Medicine | Admitting: Emergency Medicine

## 2013-07-23 ENCOUNTER — Emergency Department (HOSPITAL_COMMUNITY): Payer: Medicare Other

## 2013-07-23 DIAGNOSIS — N390 Urinary tract infection, site not specified: Secondary | ICD-10-CM

## 2013-07-23 DIAGNOSIS — I10 Essential (primary) hypertension: Secondary | ICD-10-CM | POA: Insufficient documentation

## 2013-07-23 DIAGNOSIS — Z79899 Other long term (current) drug therapy: Secondary | ICD-10-CM | POA: Insufficient documentation

## 2013-07-23 DIAGNOSIS — M25519 Pain in unspecified shoulder: Secondary | ICD-10-CM | POA: Insufficient documentation

## 2013-07-23 DIAGNOSIS — Z7982 Long term (current) use of aspirin: Secondary | ICD-10-CM | POA: Insufficient documentation

## 2013-07-23 DIAGNOSIS — E079 Disorder of thyroid, unspecified: Secondary | ICD-10-CM | POA: Insufficient documentation

## 2013-07-23 DIAGNOSIS — M25569 Pain in unspecified knee: Secondary | ICD-10-CM | POA: Insufficient documentation

## 2013-07-23 HISTORY — DX: Disorder of thyroid, unspecified: E07.9

## 2013-07-23 HISTORY — DX: Essential (primary) hypertension: I10

## 2013-07-23 LAB — CBC WITH DIFFERENTIAL/PLATELET
BASOS ABS: 0 10*3/uL (ref 0.0–0.1)
Basophils Relative: 0 % (ref 0–1)
EOS PCT: 1 % (ref 0–5)
Eosinophils Absolute: 0.1 10*3/uL (ref 0.0–0.7)
HCT: 30.9 % — ABNORMAL LOW (ref 36.0–46.0)
Hemoglobin: 10.6 g/dL — ABNORMAL LOW (ref 12.0–15.0)
LYMPHS ABS: 1 10*3/uL (ref 0.7–4.0)
LYMPHS PCT: 16 % (ref 12–46)
MCH: 38.7 pg — ABNORMAL HIGH (ref 26.0–34.0)
MCHC: 34.3 g/dL (ref 30.0–36.0)
MCV: 112.8 fL — AB (ref 78.0–100.0)
MONOS PCT: 4 % (ref 3–12)
Monocytes Absolute: 0.2 10*3/uL (ref 0.1–1.0)
NEUTROS PCT: 79 % — AB (ref 43–77)
Neutro Abs: 4.7 10*3/uL (ref 1.7–7.7)
PLATELETS: 174 10*3/uL (ref 150–400)
RBC: 2.74 MIL/uL — AB (ref 3.87–5.11)
RDW: 14.8 % (ref 11.5–15.5)
WBC: 6 10*3/uL (ref 4.0–10.5)

## 2013-07-23 LAB — BASIC METABOLIC PANEL
BUN: 23 mg/dL (ref 6–23)
CALCIUM: 8.9 mg/dL (ref 8.4–10.5)
CO2: 28 meq/L (ref 19–32)
Chloride: 104 mEq/L (ref 96–112)
Creatinine, Ser: 1.09 mg/dL (ref 0.50–1.10)
GFR calc non Af Amer: 46 mL/min — ABNORMAL LOW (ref >90)
GFR, EST AFRICAN AMERICAN: 54 mL/min — AB (ref >90)
Glucose, Bld: 94 mg/dL (ref 70–99)
Potassium: 4.7 mEq/L (ref 3.7–5.3)
SODIUM: 143 meq/L (ref 137–147)

## 2013-07-23 LAB — URINE MICROSCOPIC-ADD ON

## 2013-07-23 LAB — URINALYSIS, ROUTINE W REFLEX MICROSCOPIC
Bilirubin Urine: NEGATIVE
Glucose, UA: NEGATIVE mg/dL
NITRITE: NEGATIVE
PROTEIN: 30 mg/dL — AB
SPECIFIC GRAVITY, URINE: 1.025 (ref 1.005–1.030)
UROBILINOGEN UA: 1 mg/dL (ref 0.0–1.0)
pH: 6 (ref 5.0–8.0)

## 2013-07-23 LAB — GLUCOSE, CAPILLARY: GLUCOSE-CAPILLARY: 71 mg/dL (ref 70–99)

## 2013-07-23 LAB — TROPONIN I

## 2013-07-23 MED ORDER — SODIUM CHLORIDE 0.9 % IV SOLN: INTRAVENOUS | Status: DC

## 2013-07-23 MED ORDER — DEXTROSE 5 % IV SOLN
1.0000 g | Freq: Once | INTRAVENOUS | Status: AC
  Administered 2013-07-23: 1 g via INTRAVENOUS
  Filled 2013-07-23: qty 10

## 2013-07-23 MED ORDER — SODIUM CHLORIDE 0.9 % IV BOLUS (SEPSIS)
250.0000 mL | Freq: Once | INTRAVENOUS | Status: AC
  Administered 2013-07-23: 250 mL via INTRAVENOUS

## 2013-07-23 MED ORDER — CEPHALEXIN 500 MG PO CAPS: 500.0000 mg | ORAL_CAPSULE | Freq: Four times a day (QID) | ORAL | Status: DC

## 2013-07-23 NOTE — ED Provider Notes (Signed)
CSN: [id number]     Arrival date & time 07/23/13  1334 History   This chart was scribed for Shelda Jakes, MD, by Yevette Edwards, ED Scribe. This patient was seen in room APA05/APA05 and the patient's care was started at 2:38 PM.  First MD Initiated Contact with Patient 07/23/13 1408     Chief Complaint  Patient presents with  . Near Syncope   Patient is a 80 y.o. female presenting with near-syncope. The history is provided by the patient and a relative. No language interpreter was used.  Near Syncope This is a new problem. The current episode started 3 to 5 hours ago. The problem occurs rarely. The problem has been gradually improving. Pertinent negatives include no chest pain, no abdominal pain, no headaches and no shortness of breath. Nothing aggravates the symptoms. Nothing relieves the symptoms. She has tried nothing for the symptoms.   HPI Comments: ERRIN WHITELAW is a 80 y.o. female, with a h/o HTN, who was brought to the ED via EMS due to a near-syncopal episode which occurred at church three hours ago.  The pt was treated by EMS with sugar, and they measured her CBG at 77. The pt reports she believes she fell asleep stating the church was very warm. Her niece, who witnessed the event, reports the pt appeared somnolent and confused. She denies any pain, visual changes, and a headache. The pt is a non-smoker.   Past Medical History  Diagnosis Date  . Hypertension   . Thyroid disease    Past Surgical History  Procedure Laterality Date  . Cholecystectomy  2010    Gall Bladder   Family History  Problem Relation Age of Onset  . Hypertension Mother   . Cancer Sister   . Heart disease Brother   . Cancer Brother    History  Substance Use Topics  . Smoking status: Never Smoker   . Smokeless tobacco: Never Used  . Alcohol Use: No   No OB history provided.  Review of Systems  Constitutional: Negative for fever.  HENT: Negative for congestion, rhinorrhea and sore throat.    Eyes: Negative for visual disturbance.  Respiratory: Negative for cough and shortness of breath.   Cardiovascular: Positive for near-syncope. Negative for chest pain and leg swelling.  Gastrointestinal: Negative for nausea, vomiting, abdominal pain and diarrhea.  Genitourinary: Negative for dysuria.  Musculoskeletal: Positive for arthralgias (Knee and shoulder; baseline). Negative for back pain and neck pain.  Skin: Negative for rash.  Neurological: Positive for syncope (Near-syncope). Negative for headaches.  Hematological: Does not bruise/bleed easily.  All other systems reviewed and are negative.    Allergies  Review of patient's allergies indicates no known allergies.  Home Medications   Current Outpatient Rx  Name  Route  Sig  Dispense  Refill  . allopurinol (ZYLOPRIM) 100 MG tablet   Oral   Take 100 mg by mouth every evening.          Marland Kitchen aspirin 81 MG tablet   Oral   Take 81 mg by mouth every evening.          . diazepam (VALIUM) 5 MG tablet   Oral   Take 5 mg by mouth at bedtime as needed (sleep).          . DIOVAN 80 MG tablet   Oral   Take 80 mg by mouth daily.          Marland Kitchen levothyroxine (SYNTHROID, LEVOTHROID) 75 MCG tablet  Oral   Take 75 mcg by mouth daily.          . niacin (NIASPAN) 500 MG CR tablet   Oral   Take 500 mg by mouth at bedtime.          . simvastatin (ZOCOR) 40 MG tablet   Oral   Take 40 mg by mouth every evening.          . cephALEXin (KEFLEX) 500 MG capsule   Oral   Take 1 capsule (500 mg total) by mouth 4 (four) times daily.   28 capsule   0    Triage Vitals: BP 166/69  Pulse 87  Temp(Src) 98.3 F (36.8 C) (Oral)  Resp 14  Ht 5\' 10"  (1.778 m)  Wt 167 lb (75.751 kg)  BMI 23.96 kg/m2  SpO2 95%  Physical Exam  Nursing note and vitals reviewed. Constitutional: She is oriented to person, place, and time. She appears well-developed and well-nourished. No distress.  HENT:  Head: Normocephalic and atraumatic.   Mouth/Throat: Oropharynx is clear and moist.  Eyes: EOM are normal.  Neck: Neck supple. No tracheal deviation present.  Cardiovascular: Normal rate, regular rhythm and normal heart sounds.   No murmur heard. Pulmonary/Chest: Effort normal and breath sounds normal. No respiratory distress. She has no wheezes.  Abdominal: Soft. Bowel sounds are normal. There is no tenderness.  Musculoskeletal: Normal range of motion. She exhibits no edema.  Neurological: She is alert and oriented to person, place, and time.  Skin: Skin is warm and dry.  Psychiatric: She has a normal mood and affect. Her behavior is normal.    ED Course  Procedures (including critical care time)  DIAGNOSTIC STUDIES: Oxygen Saturation is 93% on room air, adequate by my interpretation.    COORDINATION OF CARE:  2:44 PM- Discussed treatment plan with patient, and the patient agreed to the plan.   Labs Review Labs Reviewed  BASIC METABOLIC PANEL - Abnormal; Notable for the following:    GFR calc non Af Amer 46 (*)    GFR calc Af Amer 54 (*)    All other components within normal limits  CBC WITH DIFFERENTIAL - Abnormal; Notable for the following:    RBC 2.74 (*)    Hemoglobin 10.6 (*)    HCT 30.9 (*)    MCV 112.8 (*)    MCH 38.7 (*)    Neutrophils Relative % 79 (*)    All other components within normal limits  URINALYSIS, ROUTINE W REFLEX MICROSCOPIC - Abnormal; Notable for the following:    Color, Urine AMBER (*)    APPearance HAZY (*)    Hgb urine dipstick TRACE (*)    Ketones, ur TRACE (*)    Protein, ur 30 (*)    Leukocytes, UA MODERATE (*)    All other components within normal limits  URINE MICROSCOPIC-ADD ON - Abnormal; Notable for the following:    Bacteria, UA FEW (*)    Casts HYALINE CASTS (*)    All other components within normal limits  URINE CULTURE  GLUCOSE, CAPILLARY  TROPONIN I   Results for orders placed during the hospital encounter of 07/23/13  GLUCOSE, CAPILLARY      Result Value  Range   Glucose-Capillary 71  70 - 99 mg/dL   Comment 1 Documented in Chart    BASIC METABOLIC PANEL      Result Value Range   Sodium 143  137 - 147 mEq/L   Potassium 4.7  3.7 - 5.3  mEq/L   Chloride 104  96 - 112 mEq/L   CO2 28  19 - 32 mEq/L   Glucose, Bld 94  70 - 99 mg/dL   BUN 23  6 - 23 mg/dL   Creatinine, Ser 1.61  0.50 - 1.10 mg/dL   Calcium 8.9  8.4 - 09.6 mg/dL   GFR calc non Af Amer 46 (*) >90 mL/min   GFR calc Af Amer 54 (*) >90 mL/min  CBC WITH DIFFERENTIAL      Result Value Range   WBC 6.0  4.0 - 10.5 K/uL   RBC 2.74 (*) 3.87 - 5.11 MIL/uL   Hemoglobin 10.6 (*) 12.0 - 15.0 g/dL   HCT 04.5 (*) 40.9 - 81.1 %   MCV 112.8 (*) 78.0 - 100.0 fL   MCH 38.7 (*) 26.0 - 34.0 pg   MCHC 34.3  30.0 - 36.0 g/dL   RDW 91.4  78.2 - 95.6 %   Platelets 174  150 - 400 K/uL   Neutrophils Relative % 79 (*) 43 - 77 %   Lymphocytes Relative 16  12 - 46 %   Monocytes Relative 4  3 - 12 %   Eosinophils Relative 1  0 - 5 %   Basophils Relative 0  0 - 1 %   Neutro Abs 4.7  1.7 - 7.7 K/uL   Lymphs Abs 1.0  0.7 - 4.0 K/uL   Monocytes Absolute 0.2  0.1 - 1.0 K/uL   Eosinophils Absolute 0.1  0.0 - 0.7 K/uL   Basophils Absolute 0.0  0.0 - 0.1 K/uL   Smear Review MORPHOLOGY UNREMARKABLE    TROPONIN I      Result Value Range   Troponin I <0.30  <0.30 ng/mL  URINALYSIS, ROUTINE W REFLEX MICROSCOPIC      Result Value Range   Color, Urine AMBER (*) YELLOW   APPearance HAZY (*) CLEAR   Specific Gravity, Urine 1.025  1.005 - 1.030   pH 6.0  5.0 - 8.0   Glucose, UA NEGATIVE  NEGATIVE mg/dL   Hgb urine dipstick TRACE (*) NEGATIVE   Bilirubin Urine NEGATIVE  NEGATIVE   Ketones, ur TRACE (*) NEGATIVE mg/dL   Protein, ur 30 (*) NEGATIVE mg/dL   Urobilinogen, UA 1.0  0.0 - 1.0 mg/dL   Nitrite NEGATIVE  NEGATIVE   Leukocytes, UA MODERATE (*) NEGATIVE  URINE MICROSCOPIC-ADD ON      Result Value Range   WBC, UA 21-50  <3 WBC/hpf   Bacteria, UA FEW (*) RARE   Casts HYALINE CASTS (*) NEGATIVE     Imaging Review Dg Chest 2 View  07/23/2013   CLINICAL DATA:  Near syncope  EXAM: CHEST  2 VIEW  COMPARISON:  None.  FINDINGS: The lungs are mildly hyperinflated. There is minimal blunting of the left lateral costophrenic angle. There is no pleural effusion. The cardiac silhouette is normal in size. The pulmonary vascularity is not engorged. The mediastinum is normal in width. There is degenerative disc change of the thoracic spine. There are degenerative changes of both shoulders.  IMPRESSION: There is hyperinflation consistent with COPD. There is no evidence of pneumonia nor of CHF. Blunting of the left lateral costophrenic angle may be related to scarring or to a small left pleural effusion.   Electronically Signed   By: David  [place]   On: 07/23/2013 15:32   Ct Head Wo Contrast  07/23/2013   CLINICAL DATA:  NEAR SYNCOPE  EXAM: CT HEAD WITHOUT CONTRAST  TECHNIQUE:  Contiguous axial images were obtained from the base of the skull through the vertex without intravenous contrast.  COMPARISON:  DX BONE DENSITY dated 04/06/2011  FINDINGS: No acute intracranial hemorrhage. No focal mass lesion. No CT evidence of acute infarction. No midline shift or mass effect. No hydrocephalus. Basilar cisterns are patent.  There is mild periventricular and subcortical white matter hypodensities. Diffuse cortical atrophy noted.  There are soft tissue nodules in the scalp posterior to the left occipital bone measuring 1 cm each (image number 11). Paranasal sinuses and mastoid air cells are clear.  IMPRESSION: 1. No acute intracranial findings. 2. White matter microvascular disease and mild atrophy.   Electronically Signed   By: Genevive Bi M.D.   On: 07/23/2013 15:35    EKG Interpretation    Date/Time:  Sunday July 23 2013 13:43:54 EST Ventricular Rate:  78 PR Interval:  202 QRS Duration: 84 QT Interval:  372 QTC Calculation: 424 R Axis:   41 Text Interpretation:  Normal sinus rhythm T wave abnormality,  consider lateral ischemia Abnormal ECG When compared with ECG of 18-Nov-2009 12:52, No significant change was found Confirmed by Ariea Rochin  MD, Jaidalyn Schillo ([id number]) on 07/23/2013 2:15:31 PM            MDM   1. UTI (lower urinary tract infection)    Patient noted by family members to be a little less responsive than her baseline. Workup showed urinary tract infection. Rest of workup negative EKG without any significant Dolphus Jenny troponin was negative chest x-ray negative for pneumonia CHF or pulmonary edema or. No significant leukocytosis urinalysis definitely abnormal. Patient given 1 g of Rocephin IV piggyback in the emergency department. Family members will be able to stay with her she'll be continued on Keflex.    I personally performed the services described in this documentation, which was scribed in my presence. The recorded information has been reviewed and is accurate.      Shelda Jakes, MD 07/23/13 [phone]

## 2013-07-23 NOTE — ED Notes (Signed)
Reported that pt became less responsive and church members gave pt sugar, CBG 77 done by [facility], pt denies SOB or pain

## 2013-07-23 NOTE — Discharge Instructions (Signed)
Take antibiotic as directed for the urinary tract infection. Would expect you to be improved in one to 2 days. Return if not for any newer worse symptoms. Family member will stay with you and he shows signs of improvement.

## 2013-07-24 LAB — URINE CULTURE

## 2014-02-08 ENCOUNTER — Encounter (INDEPENDENT_AMBULATORY_CARE_PROVIDER_SITE_OTHER): Payer: Self-pay | Admitting: *Deleted

## 2014-02-08 ENCOUNTER — Other Ambulatory Visit (HOSPITAL_COMMUNITY): Payer: Self-pay | Admitting: Family Medicine

## 2014-02-08 DIAGNOSIS — Z139 Encounter for screening, unspecified: Secondary | ICD-10-CM

## 2014-02-08 DIAGNOSIS — M810 Age-related osteoporosis without current pathological fracture: Secondary | ICD-10-CM

## 2014-02-14 ENCOUNTER — Ambulatory Visit (HOSPITAL_COMMUNITY)
Admission: RE | Admit: 2014-02-14 | Discharge: 2014-02-14 | Disposition: A | Discharge status: Home / Self Care | Payer: Medicare Other | Source: Ambulatory Visit | Attending: Family Medicine | Admitting: Family Medicine

## 2014-02-14 DIAGNOSIS — Z1231 Encounter for screening mammogram for malignant neoplasm of breast: Secondary | ICD-10-CM | POA: Diagnosis present

## 2014-02-14 DIAGNOSIS — M810 Age-related osteoporosis without current pathological fracture: Secondary | ICD-10-CM | POA: Insufficient documentation

## 2014-02-14 DIAGNOSIS — Z139 Encounter for screening, unspecified: Secondary | ICD-10-CM

## 2014-05-22 ENCOUNTER — Encounter (HOSPITAL_COMMUNITY): Payer: Self-pay

## 2014-05-22 ENCOUNTER — Emergency Department (HOSPITAL_COMMUNITY): Payer: Medicare Other

## 2014-05-22 ENCOUNTER — Inpatient Hospital Stay (HOSPITAL_COMMUNITY): Payer: Medicare Other

## 2014-05-22 ENCOUNTER — Inpatient Hospital Stay (HOSPITAL_COMMUNITY)
Admission: EM | Admit: 2014-05-22 | Discharge: 2014-05-26 | DRG: 558 | Disposition: A | Discharge status: Skilled Nursing Facility | Payer: Medicare Other | Attending: Family Medicine | Admitting: Family Medicine

## 2014-05-22 DIAGNOSIS — T796XXA Traumatic ischemia of muscle, initial encounter: Secondary | ICD-10-CM

## 2014-05-22 DIAGNOSIS — N390 Urinary tract infection, site not specified: Secondary | ICD-10-CM | POA: Diagnosis present

## 2014-05-22 DIAGNOSIS — I129 Hypertensive chronic kidney disease with stage 1 through stage 4 chronic kidney disease, or unspecified chronic kidney disease: Secondary | ICD-10-CM | POA: Diagnosis present

## 2014-05-22 DIAGNOSIS — Z9181 History of falling: Secondary | ICD-10-CM

## 2014-05-22 DIAGNOSIS — I10 Essential (primary) hypertension: Secondary | ICD-10-CM

## 2014-05-22 DIAGNOSIS — W19XXXA Unspecified fall, initial encounter: Secondary | ICD-10-CM

## 2014-05-22 DIAGNOSIS — M6282 Rhabdomyolysis: Principal | ICD-10-CM | POA: Diagnosis present

## 2014-05-22 DIAGNOSIS — N179 Acute kidney failure, unspecified: Secondary | ICD-10-CM

## 2014-05-22 DIAGNOSIS — E860 Dehydration: Secondary | ICD-10-CM | POA: Diagnosis present

## 2014-05-22 DIAGNOSIS — Z809 Family history of malignant neoplasm, unspecified: Secondary | ICD-10-CM | POA: Diagnosis not present

## 2014-05-22 DIAGNOSIS — Z8249 Family history of ischemic heart disease and other diseases of the circulatory system: Secondary | ICD-10-CM | POA: Diagnosis not present

## 2014-05-22 DIAGNOSIS — N183 Chronic kidney disease, stage 3 (moderate): Secondary | ICD-10-CM | POA: Diagnosis present

## 2014-05-22 DIAGNOSIS — R531 Weakness: Secondary | ICD-10-CM

## 2014-05-22 DIAGNOSIS — Y92009 Unspecified place in unspecified non-institutional (private) residence as the place of occurrence of the external cause: Secondary | ICD-10-CM

## 2014-05-22 DIAGNOSIS — E790 Hyperuricemia without signs of inflammatory arthritis and tophaceous disease: Secondary | ICD-10-CM | POA: Diagnosis present

## 2014-05-22 LAB — URINE MICROSCOPIC-ADD ON

## 2014-05-22 LAB — URINALYSIS, ROUTINE W REFLEX MICROSCOPIC
GLUCOSE, UA: NEGATIVE mg/dL
Ketones, ur: 15 mg/dL — AB
Leukocytes, UA: NEGATIVE
Nitrite: NEGATIVE
Protein, ur: >300 mg/dL — AB
Specific Gravity, Urine: >1.03 — ABNORMAL HIGH (ref 1.005–1.030)
Urobilinogen, UA: 1 mg/dL (ref 0.0–1.0)
pH: 5 (ref 5.0–8.0)

## 2014-05-22 LAB — COMPREHENSIVE METABOLIC PANEL
ALT: 83 U/L — ABNORMAL HIGH (ref 0–35)
ANION GAP: 22 — AB (ref 5–15)
AST: 251 U/L — AB (ref 0–37)
Albumin: 4.1 g/dL (ref 3.5–5.2)
Alkaline Phosphatase: 83 U/L (ref 39–117)
BUN: 35 mg/dL — AB (ref 6–23)
CO2: 28 mEq/L (ref 19–32)
CREATININE: 1.7 mg/dL — AB (ref 0.50–1.10)
Calcium: 10 mg/dL (ref 8.4–10.5)
Chloride: 95 mEq/L — ABNORMAL LOW (ref 96–112)
GFR calc Af Amer: 31 mL/min — ABNORMAL LOW (ref >90)
GFR calc non Af Amer: 27 mL/min — ABNORMAL LOW (ref >90)
Glucose, Bld: 111 mg/dL — ABNORMAL HIGH (ref 70–99)
Potassium: 3 mEq/L — ABNORMAL LOW (ref 3.7–5.3)
Sodium: 145 mEq/L (ref 137–147)
TOTAL PROTEIN: 7.4 g/dL (ref 6.0–8.3)
Total Bilirubin: 1.1 mg/dL (ref 0.3–1.2)

## 2014-05-22 LAB — CBC
HCT: 44.5 % (ref 36.0–46.0)
HEMOGLOBIN: 15.2 g/dL — AB (ref 12.0–15.0)
MCH: 31.9 pg (ref 26.0–34.0)
MCHC: 34.2 g/dL (ref 30.0–36.0)
MCV: 93.5 fL (ref 78.0–100.0)
Platelets: 217 10*3/uL (ref 150–400)
RBC: 4.76 MIL/uL (ref 3.87–5.11)
RDW: 12.7 % (ref 11.5–15.5)
WBC: 18.7 10*3/uL — ABNORMAL HIGH (ref 4.0–10.5)

## 2014-05-22 LAB — DIFFERENTIAL
Basophils Absolute: 0 10*3/uL (ref 0.0–0.1)
Basophils Relative: 0 % (ref 0–1)
EOS ABS: 0 10*3/uL (ref 0.0–0.7)
Eosinophils Relative: 0 % (ref 0–5)
Lymphocytes Relative: 4 % — ABNORMAL LOW (ref 12–46)
Lymphs Abs: 0.7 10*3/uL (ref 0.7–4.0)
MONO ABS: 1 10*3/uL (ref 0.1–1.0)
MONOS PCT: 5 % (ref 3–12)
NEUTROS ABS: 17.1 10*3/uL — AB (ref 1.7–7.7)
NEUTROS PCT: 91 % — AB (ref 43–77)

## 2014-05-22 LAB — PROTIME-INR
INR: 1.12 (ref 0.00–1.49)
Prothrombin Time: 14.5 seconds (ref 11.6–15.2)

## 2014-05-22 LAB — RAPID URINE DRUG SCREEN, HOSP PERFORMED
AMPHETAMINES: NOT DETECTED
BENZODIAZEPINES: POSITIVE — AB
Barbiturates: NOT DETECTED
COCAINE: NOT DETECTED
Opiates: NOT DETECTED
TETRAHYDROCANNABINOL: NOT DETECTED

## 2014-05-22 LAB — TROPONIN I
Troponin I: <0.3 ng/mL (ref <0.30)
Troponin I: <0.3 ng/mL (ref <0.30)

## 2014-05-22 LAB — ETHANOL

## 2014-05-22 LAB — CK: Total CK: 7542 U/L — ABNORMAL HIGH (ref 7–177)

## 2014-05-22 LAB — LACTIC ACID, PLASMA: Lactic Acid, Venous: 1.6 mmol/L (ref 0.5–2.2)

## 2014-05-22 LAB — APTT: APTT: 31 s (ref 24–37)

## 2014-05-22 MED ORDER — SODIUM CHLORIDE 0.9 % IV SOLN: INTRAVENOUS | Status: DC

## 2014-05-22 MED ORDER — LEVOTHYROXINE SODIUM 75 MCG PO TABS
75.0000 ug | ORAL_TABLET | Freq: Every day | ORAL | Status: DC
  Administered 2014-05-23 – 2014-05-26 (×4): 75 ug via ORAL
  Filled 2014-05-22 (×6): qty 1

## 2014-05-22 MED ORDER — KCL IN DEXTROSE-NACL 20-5-0.9 MEQ/L-%-% IV SOLN: INTRAVENOUS | Status: DC

## 2014-05-22 MED ORDER — ENOXAPARIN SODIUM 30 MG/0.3ML ~~LOC~~ SOLN
30.0000 mg | SUBCUTANEOUS | Status: DC
  Administered 2014-05-22 – 2014-05-25 (×4): 30 mg via SUBCUTANEOUS
  Filled 2014-05-22 (×4): qty 0.3

## 2014-05-22 MED ORDER — ALLOPURINOL 100 MG PO TABS
100.0000 mg | ORAL_TABLET | Freq: Every evening | ORAL | Status: DC
  Administered 2014-05-24 – 2014-05-25 (×2): 100 mg via ORAL
  Filled 2014-05-22 (×4): qty 1

## 2014-05-22 MED ORDER — CETYLPYRIDINIUM CHLORIDE 0.05 % MT LIQD
7.0000 mL | Freq: Two times a day (BID) | OROMUCOSAL | Status: DC
  Administered 2014-05-22 – 2014-05-26 (×8): 7 mL via OROMUCOSAL

## 2014-05-22 MED ORDER — ONDANSETRON HCL 4 MG PO TABS: 4.0000 mg | ORAL_TABLET | Freq: Four times a day (QID) | ORAL | Status: DC | PRN

## 2014-05-22 MED ORDER — SODIUM CHLORIDE 0.9 % IV BOLUS (SEPSIS)
1000.0000 mL | Freq: Once | INTRAVENOUS | Status: AC
  Administered 2014-05-22: 1000 mL via INTRAVENOUS

## 2014-05-22 MED ORDER — ENOXAPARIN SODIUM 30 MG/0.3ML ~~LOC~~ SOLN: 30.0000 mg | SUBCUTANEOUS | Status: DC

## 2014-05-22 MED ORDER — ONDANSETRON HCL 4 MG/2ML IJ SOLN: 4.0000 mg | Freq: Four times a day (QID) | INTRAMUSCULAR | Status: DC | PRN

## 2014-05-22 MED ORDER — ASPIRIN EC 81 MG PO TBEC: 81.0000 mg | DELAYED_RELEASE_TABLET | Freq: Every day | ORAL | Status: DC

## 2014-05-22 MED ORDER — SODIUM CHLORIDE 0.9 % IV SOLN
INTRAVENOUS | Status: DC
  Administered 2014-05-22: 16:00:00 via INTRAVENOUS

## 2014-05-22 MED ORDER — ONDANSETRON HCL 4 MG/2ML IJ SOLN
4.0000 mg | Freq: Once | INTRAMUSCULAR | Status: AC
  Administered 2014-05-22: 4 mg via INTRAVENOUS
  Filled 2014-05-22: qty 2

## 2014-05-22 MED ORDER — DEXTROSE 5 % IV SOLN
1.0000 g | INTRAVENOUS | Status: DC
  Administered 2014-05-22 – 2014-05-25 (×4): 1 g via INTRAVENOUS
  Filled 2014-05-22 (×6): qty 10

## 2014-05-22 MED ORDER — ASPIRIN EC 81 MG PO TBEC
81.0000 mg | DELAYED_RELEASE_TABLET | Freq: Every day | ORAL | Status: DC
  Administered 2014-05-24 – 2014-05-26 (×3): 81 mg via ORAL
  Filled 2014-05-22 (×3): qty 1

## 2014-05-22 NOTE — H&P (Signed)
[facility] History and Physical  Paula Sackdith W Neale XBJ:[id number] DOB: 07/07/1929 DOA: 05/22/2014  Referring physician: ER PCP: Alice ReichertMCINNIS,ANGUS G, MD   Chief Complaint: weakness  HPI: Paula Sackdith W Eissler is a 80 y.o. female  This is an 80 year old lady who apparently was found on her floor at home, possibly having spent several hours there. The patient is somewhat confused when she came to the emergency room.she apparently had a fall yesterday. The family found her lying on the floor this morning when she did not answer her phone after several attempts. She apparently had been on the floor since 4 PM yesterday. Evaluation emergency room shows that she is dehydrated and has mild rhabdomyolysis. MRI brain scan does not show any evidence of stroke. She is now being admitted for further management.     Past Medical History  Diagnosis Date  . Hypertension   . Thyroid disease    Past Surgical History  Procedure Laterality Date  . Cholecystectomy  2010    Gall Bladder   Social History:  reports that she has never smoked. She has never used smokeless tobacco. She reports that she does not drink alcohol or use illicit drugs.  No Known Allergies  Family History  Problem Relation Age of Onset  . Hypertension Mother   . Cancer Sister   . Heart disease Brother   . Cancer Brother      Prior to Admission medications   Medication Sig Start Date End Date Taking? Authorizing Provider  alendronate (FOSAMAX) 70 MG tablet Take 70 mg by mouth once a week.  05/14/14  Yes Historical Provider, MD  allopurinol (ZYLOPRIM) 100 MG tablet Take 100 mg by mouth every evening.  12/13/12  Yes Historical Provider, MD  aspirin EC 81 MG tablet Take 81 mg by mouth daily.   Yes Historical Provider, MD  diazepam (VALIUM) 5 MG tablet Take 5 mg by mouth at bedtime as needed (sleep).    Yes Historical Provider, MD  DIOVAN 80 MG tablet Take 80 mg by mouth daily.  12/31/12  Yes Historical Provider, MD  levothyroxine  (SYNTHROID, LEVOTHROID) 75 MCG tablet Take 75 mcg by mouth daily.  12/19/12  Yes Historical Provider, MD  niacin (NIASPAN) 500 MG CR tablet Take 500 mg by mouth at bedtime.  12/13/12  Yes Historical Provider, MD  simvastatin (ZOCOR) 40 MG tablet Take 40 mg by mouth every evening.  12/13/12  Yes Historical Provider, MD  cephALEXin (KEFLEX) 500 MG capsule Take 1 capsule (500 mg total) by mouth 4 (four) times daily. Patient not taking: Reported on 05/22/2014 07/23/13   Vanetta MuldersScott Zackowski, MD   Physical Exam: Filed Vitals:   05/22/14 1630 05/22/14 1700 05/22/14 1800 05/22/14 1801  BP:  128/92 152/63   Pulse: 112   104  Temp:      TempSrc:      Resp: 12 16 18 16   SpO2: 99%   97%    Wt Readings from Last 3 Encounters:  07/23/13 75.751 kg (167 lb)  05/03/13 73.029 kg (161 lb)  01/04/13 76.204 kg (168 lb)    General:  Appears clinically dehydrated. Eyes: PERRL, normal lids, irises & conjunctiva ENT: grossly normal hearing, lips & tongue Neck: no LAD, masses or thyromegaly Cardiovascular: RRR, no m/r/g. No LE edema. Telemetry: SR, no arrhythmias  Respiratory: CTA bilaterally, no w/r/r. Normal respiratory effort. Abdomen: soft, ntnd Skin: no rash or induration seen on limited exam Musculoskeletal: grossly normal tone BUE/BLE  Neurologic: grossly non-focal.appears somewhat confused.  Labs on Admission:  Basic Metabolic Panel:  Recent Labs Lab 05/22/14 1416  NA 145  K 3.0*  CL 95*  CO2 28  GLUCOSE 111*  BUN 35*  CREATININE 1.70*  CALCIUM 10.0   Liver Function Tests:  Recent Labs Lab 05/22/14 1416  AST 251*  ALT 83*  ALKPHOS 83  BILITOT 1.1  PROT 7.4  ALBUMIN 4.1   No results for input(s): LIPASE, AMYLASE in the last 168 hours. No results for input(s): AMMONIA in the last 168 hours. CBC:  Recent Labs Lab 05/22/14 1416  WBC 18.7*  NEUTROABS 17.1*  HGB 15.2*  HCT 44.5  MCV 93.5  PLT 217   Cardiac Enzymes:  Recent Labs Lab 05/22/14 1416  CKTOTAL  7542*  TROPONINI <0.30    BNP (last 3 results) No results for input(s): PROBNP in the last 8760 hours. CBG: No results for input(s): GLUCAP in the last 168 hours.  Radiological Exams on Admission: Ct Head Wo Contrast  05/22/2014   CLINICAL DATA:  Fall, slipped on floor  EXAM: CT HEAD WITHOUT CONTRAST  CT CERVICAL SPINE WITHOUT CONTRAST  TECHNIQUE: Multidetector CT imaging of the head and cervical spine was performed following the standard protocol without intravenous contrast. Multiplanar CT image reconstructions of the cervical spine were also generated.  COMPARISON:  07/23/2013  FINDINGS: CT HEAD FINDINGS  No skull fracture is noted. Nodular subcutaneous soft tissue density left occipital region subcutaneous axial image 8 measures 1.3 cm stable in size in appearance from prior exam.  Stable atrophy and chronic white matter disease. No intracranial hemorrhage, mass effect or midline shift. No acute cortical infarction. Punctate calcification in left basal ganglia again noted. No mass lesion is noted on this unenhanced scan. Mild mucosal thickening bilateral sphenoid sinus.  CT CERVICAL SPINE FINDINGS  Axial images of the cervical spine shows no acute fracture or subluxation. Computer processed images shows no acute fracture or subluxation. Degenerative changes are noted C1-C2 articulation. Mild disc space flattening noted at C5-C6 level. Anterior osteophytes noted lower endplate of C5 and C6 vertebral bodies. Facet degenerative changes noted T1 level.  No prevertebral soft tissue swelling. No pneumothorax in visualized lung apices. Cervical airway is patent.  IMPRESSION: 1. No acute intracranial abnormality. Stable atrophy and chronic white matter disease. 2. Stable subcutaneous soft tissue nodules in left occipital region. 3. No cervical spine acute fracture or subluxation. Degenerative changes as described above.   Electronically Signed   By: Natasha MeadLiviu  Pop M.D.   On: 05/22/2014 15:39   Ct Cervical  Spine Wo Contrast  05/22/2014   CLINICAL DATA:  Fall, slipped on floor  EXAM: CT HEAD WITHOUT CONTRAST  CT CERVICAL SPINE WITHOUT CONTRAST  TECHNIQUE: Multidetector CT imaging of the head and cervical spine was performed following the standard protocol without intravenous contrast. Multiplanar CT image reconstructions of the cervical spine were also generated.  COMPARISON:  07/23/2013  FINDINGS: CT HEAD FINDINGS  No skull fracture is noted. Nodular subcutaneous soft tissue density left occipital region subcutaneous axial image 8 measures 1.3 cm stable in size in appearance from prior exam.  Stable atrophy and chronic white matter disease. No intracranial hemorrhage, mass effect or midline shift. No acute cortical infarction. Punctate calcification in left basal ganglia again noted. No mass lesion is noted on this unenhanced scan. Mild mucosal thickening bilateral sphenoid sinus.  CT CERVICAL SPINE FINDINGS  Axial images of the cervical spine shows no acute fracture or subluxation. Computer processed images shows no acute fracture or subluxation.  Degenerative changes are noted C1-C2 articulation. Mild disc space flattening noted at C5-C6 level. Anterior osteophytes noted lower endplate of C5 and C6 vertebral bodies. Facet degenerative changes noted T1 level.  No prevertebral soft tissue swelling. No pneumothorax in visualized lung apices. Cervical airway is patent.  IMPRESSION: 1. No acute intracranial abnormality. Stable atrophy and chronic white matter disease. 2. Stable subcutaneous soft tissue nodules in left occipital region. 3. No cervical spine acute fracture or subluxation. Degenerative changes as described above.   Electronically Signed   By: Natasha Mead M.D.   On: 05/22/2014 15:39   Mr Brain Wo Contrast (neuro Protocol)  05/22/2014   CLINICAL DATA:  Fall. Altered mental status. Found on floor today. Right upper extremity weakness. Possible stroke.  EXAM: MRI HEAD WITHOUT CONTRAST  TECHNIQUE:  Multiplanar, multiecho pulse sequences of the brain and surrounding structures were obtained without intravenous contrast.  COMPARISON:  05/22/2014 PET-CT  FINDINGS: Images are moderately degraded by motion artifact. No acute infarct, intracranial hemorrhage, mass, midline shift, or extra-axial fluid collection is identified. Patchy T2 hyperintensities in the periventricular white matter are nonspecific but compatible with mild chronic small vessel ischemic disease. There is mild generalized cerebral atrophy.  Several soft tissue nodules are again noted in the left occipital scalp measuring 1-2 cm in size, nonspecific. There is likely a small amount of fluid in both maxillary sinuses. Mastoid air cells are grossly clear. Major intracranial vascular flow voids are not well evaluated due to motion.  IMPRESSION: Motion degraded examination without acute intracranial abnormality identified. Mild chronic small vessel ischemic disease.   Electronically Signed   By: Sebastian Ache   On: 05/22/2014 18:05    Assessment/Plan Active Problems:   Rhabdomyolysis   ARF (acute renal failure)   Weakness   HTN (hypertension)   1. Fall/weakness, unclear etiology. 2. Rhabdomyolysis. 3. Acute renal failure. 4. History of hypertension on ARB. 5. Probable UTI.  Plan: 1. Admit. 2. IV fluids. 3. Intravenous antibiotics for probable UTI. 4. Monitor renal function and CPK. 5. Serial cardiac enzymes to make sure she has not had a myocardial infarction.  Further recommendations will depend on patient's hospital progress.   Code Status: full code.  DVT Prophylaxis:Lovenox.  Family Communication: I discussed the plan with the patient's family at the bedside.   Disposition Plan: home when medically stable.   Time spent: 60 minutes.  Wilson Singer [facility] Pager [phone].

## 2014-05-22 NOTE — ED Notes (Signed)
Attempted to call report and nurse to call back for report

## 2014-05-22 NOTE — ED Notes (Signed)
MD at bedside. 

## 2014-05-22 NOTE — ED Provider Notes (Signed)
CSN: [id number]     Arrival date & time 05/22/14  1216 History  This chart was scribed for Ward Givens, MD by Tonye Royalty, ED Scribe. This patient was seen in room APA17/APA17 and the patient's care was started at 1:55 PM.    Chief Complaint  Patient presents with  . Fall   The history is provided by the patient. The history is limited by the condition of the patient. No language interpreter was used.   LEVEL V CAVEAT FOR AGE  HPI Comments: Paula Norton is a 80 y.o. female who presents to the Emergency Department complaining of a fall last night; she states she feels fine. She denies back pain or nausea. She states she lives alone. Per nephew, her family found her lying on the floor this morning when she did not answer her phone after several attempts and is not sure how long she was there; he states they last had phone contact with her yesterday. They went to see her today because she had an appointment to get an x-ray to evaluate arthritis in her knee, He states she was found in the hallway near her bathroom in her night clothes. She told family she had been there since approximately 1600 yesterday. Family present does not know if she had lost control her bladder or bowels.Her family denies history of stroke. She denies smoking or drinking.  PCP Dr Renard Matter  Past Medical History  Diagnosis Date  . Hypertension   . Thyroid disease    Past Surgical History  Procedure Laterality Date  . Cholecystectomy  2010    Gall Bladder   Family History  Problem Relation Age of Onset  . Hypertension Mother   . Cancer Sister   . Heart disease Brother   . Cancer Brother    History  Substance Use Topics  . Smoking status: Never Smoker   . Smokeless tobacco: Never Used  . Alcohol Use: No   Lives at home Lives alone  OB History    No data available     Review of Systems  Unable to perform ROS: Age  Gastrointestinal: Negative for nausea.  Musculoskeletal: Negative for back pain.       Allergies  Review of patient's allergies indicates no known allergies.  Home Medications   Prior to Admission medications   Medication Sig Start Date End Date Taking? Authorizing Provider  aspirin EC 81 MG tablet Take 81 mg by mouth daily.   Yes Historical Provider, MD  alendronate (FOSAMAX) 70 MG tablet Take 70 mg by mouth once a week.  05/14/14   Historical Provider, MD  allopurinol (ZYLOPRIM) 100 MG tablet Take 100 mg by mouth every evening.  12/13/12   Historical Provider, MD  cephALEXin (KEFLEX) 500 MG capsule Take 1 capsule (500 mg total) by mouth 4 (four) times daily. Patient not taking: Reported on 05/22/2014 07/23/13   Vanetta Mulders, MD  diazepam (VALIUM) 5 MG tablet Take 5 mg by mouth at bedtime as needed (sleep).     Historical Provider, MD  DIOVAN 80 MG tablet Take 80 mg by mouth daily.  12/31/12   Historical Provider, MD  levothyroxine (SYNTHROID, LEVOTHROID) 75 MCG tablet Take 75 mcg by mouth daily.  12/19/12   Historical Provider, MD  niacin (NIASPAN) 500 MG CR tablet Take 500 mg by mouth at bedtime.  12/13/12   Historical Provider, MD  simvastatin (ZOCOR) 40 MG tablet Take 40 mg by mouth every evening.  12/13/12   Historical Provider,  MD   BP 158/65 mmHg  Pulse 96  Temp(Src) 96.2 F (35.7 C) (Rectal)  Resp 11  SpO2 100% Vital signs normal    Physical Exam  Constitutional: She is oriented to person, place, and time.  Non-toxic appearance. She does not appear ill. No distress.  Frail elderly female. Speech hard to understand, which family states is not normal  HENT:  Head: Normocephalic and atraumatic.  Right Ear: External ear normal.  Left Ear: External ear normal.  Nose: Nose normal. No mucosal edema or rhinorrhea.  Mouth/Throat: Mucous membranes are normal. No dental abscesses or uvula swelling.  Tongue dry  Eyes: Conjunctivae and EOM are normal. Pupils are equal, round, and reactive to light.  Neck: Normal range of motion and full passive range of motion  without pain. Neck supple.  Cardiovascular: Normal rate, regular rhythm and normal heart sounds.  Exam reveals no gallop and no friction rub.   No murmur heard. Pulmonary/Chest: Effort normal and breath sounds normal. No respiratory distress. She has no wheezes. She has no rhonchi. She has no rales. She exhibits no tenderness and no crepitus.  Abdominal: Soft. Normal appearance and bowel sounds are normal. She exhibits no distension. There is no tenderness. There is no rebound and no guarding.  Musculoskeletal: Normal range of motion. She exhibits no edema or tenderness.  . Abrasions on both elbows and left superior iliac crest anteriorly. Enlargement of her IP joints of her fingers. Has faint bruise over the left inferior scapular region  Neurological: She is alert and oriented to person, place, and time. She has normal strength. No cranial nerve deficit.  Weak grip on right. Lower extremities are very weak bilaterally but worse on the right. She can barely lift her LE against gravity and cannot flex her knees at all which she states is from her arthritis.   Skin: Skin is warm, dry and intact. No rash noted. No erythema. No pallor.  Psychiatric: Her speech is delayed. She is slowed.  Flat affect  Nursing note and vitals reviewed.   ED Course  Procedures (including critical care time) Medications  0.9 %  sodium chloride infusion ( Intravenous New Bag/Given 05/22/14 1618)  sodium chloride 0.9 % bolus 1,000 mL (0 mLs Intravenous Stopped 05/22/14 1617)  ondansetron (ZOFRAN) injection 4 mg (4 mg Intravenous Given 05/22/14 1428)      DIAGNOSTIC STUDIES: Oxygen Saturation is 98% on room air, normal by my interpretation.    COORDINATION OF CARE: 2:09 PM Discussed treatment plan with patient at beside, the patient agrees with the plan and has no further questions at this time.  16:20 Pt and family given her test results and need for admission for IV fluids for rhabdomyolosis.   16:56 Dr  Karilyn Cota, admit to tele, Dr Renard Matter attending. Wants to get MR of brain tonight to clarify if she did have a stroke.   Labs Review Results for orders placed or performed during the hospital encounter of 05/22/14  Ethanol  Result Value Ref Range   Alcohol, Ethyl (B) <11 0 - 11 mg/dL  Protime-INR  Result Value Ref Range   Prothrombin Time 14.5 11.6 - 15.2 seconds   INR 1.12 0.00 - 1.49  APTT  Result Value Ref Range   aPTT 31 24 - 37 seconds  CBC  Result Value Ref Range   WBC 18.7 (H) 4.0 - 10.5 K/uL   RBC 4.76 3.87 - 5.11 MIL/uL   Hemoglobin 15.2 (H) 12.0 - 15.0 g/dL   HCT 44.5  36.0 - 46.0 %   MCV 93.5 78.0 - 100.0 fL   MCH 31.9 26.0 - 34.0 pg   MCHC 34.2 30.0 - 36.0 g/dL   RDW 16.112.7 09.611.5 - 04.515.5 %   Platelets 217 150 - 400 K/uL  Differential  Result Value Ref Range   Neutrophils Relative % 91 (H) 43 - 77 %   Neutro Abs 17.1 (H) 1.7 - 7.7 K/uL   Lymphocytes Relative 4 (L) 12 - 46 %   Lymphs Abs 0.7 0.7 - 4.0 K/uL   Monocytes Relative 5 3 - 12 %   Monocytes Absolute 1.0 0.1 - 1.0 K/uL   Eosinophils Relative 0 0 - 5 %   Eosinophils Absolute 0.0 0.0 - 0.7 K/uL   Basophils Relative 0 0 - 1 %   Basophils Absolute 0.0 0.0 - 0.1 K/uL  Comprehensive metabolic panel  Result Value Ref Range   Sodium 145 137 - 147 mEq/L   Potassium 3.0 (L) 3.7 - 5.3 mEq/L   Chloride 95 (L) 96 - 112 mEq/L   CO2 28 19 - 32 mEq/L   Glucose, Bld 111 (H) 70 - 99 mg/dL   BUN 35 (H) 6 - 23 mg/dL   Creatinine, Ser 4.091.70 (H) 0.50 - 1.10 mg/dL   Calcium 81.110.0 8.4 - 91.410.5 mg/dL   Total Protein 7.4 6.0 - 8.3 g/dL   Albumin 4.1 3.5 - 5.2 g/dL   AST 782251 (H) 0 - 37 U/L   ALT 83 (H) 0 - 35 U/L   Alkaline Phosphatase 83 39 - 117 U/L   Total Bilirubin 1.1 0.3 - 1.2 mg/dL   GFR calc non Af Amer 27 (L) >90 mL/min   GFR calc Af Amer 31 (L) >90 mL/min   Anion gap 22 (H) 5 - 15  Urine Drug Screen  Result Value Ref Range   Opiates NONE DETECTED NONE DETECTED   Cocaine NONE DETECTED NONE DETECTED   Benzodiazepines  POSITIVE (A) NONE DETECTED   Amphetamines NONE DETECTED NONE DETECTED   Tetrahydrocannabinol NONE DETECTED NONE DETECTED   Barbiturates NONE DETECTED NONE DETECTED  Urinalysis, Routine w reflex microscopic  Result Value Ref Range   Color, Urine YELLOW YELLOW   APPearance HAZY (A) CLEAR   Specific Gravity, Urine >1.030 (H) 1.005 - 1.030   pH 5.0 5.0 - 8.0   Glucose, UA NEGATIVE NEGATIVE mg/dL   Hgb urine dipstick LARGE (A) NEGATIVE   Bilirubin Urine LARGE (A) NEGATIVE   Ketones, ur 15 (A) NEGATIVE mg/dL   Protein, ur >956>300 (A) NEGATIVE mg/dL   Urobilinogen, UA 1.0 0.0 - 1.0 mg/dL   Nitrite NEGATIVE NEGATIVE   Leukocytes, UA NEGATIVE NEGATIVE  Troponin I  Result Value Ref Range   Troponin I <0.30 <0.30 ng/mL  CK  Result Value Ref Range   Total CK 7542 (H) 7 - 177 U/L  Lactic acid, plasma  Result Value Ref Range   Lactic Acid, Venous 1.6 0.5 - 2.2 mmol/L  Urine microscopic-add on  Result Value Ref Range   Squamous Epithelial / LPF RARE RARE   WBC, UA 0-2 <3 WBC/hpf   RBC / HPF 3-6 <3 RBC/hpf   Bacteria, UA MANY (A) RARE   Urine-Other AMORPHOUS URATES/PHOSPHATES    Laboratory interpretation all normal except elevated BUN consistent with dehydration, concentrated urine consistent with dehydration with proteinuria, very elevated CK consistent with rhabdomyolysis, elevated hemoglobin consistent with dehydration, leukocytosis    Imaging Review Ct Head Wo Contrast  Ct Cervical Spine Wo Contrast  05/22/2014   CLINICAL DATA:  Fall, slipped on floor  EXAM: CT HEAD WITHOUT CONTRAST  CT CERVICAL SPINE WITHOUT CONTRAST  TECHNIQUE: Multidetector CT imaging of the head and cervical spine was performed following the standard protocol without intravenous contrast. Multiplanar CT image reconstructions of the cervical spine were also generated.  COMPARISON:  07/23/2013  FINDINGS: CT HEAD FINDINGS  No skull fracture is noted. Nodular subcutaneous soft tissue density left occipital region  subcutaneous axial image 8 measures 1.3 cm stable in size in appearance from prior exam.  Stable atrophy and chronic white matter disease. No intracranial hemorrhage, mass effect or midline shift. No acute cortical infarction. Punctate calcification in left basal ganglia again noted. No mass lesion is noted on this unenhanced scan. Mild mucosal thickening bilateral sphenoid sinus.  CT CERVICAL SPINE FINDINGS  Axial images of the cervical spine shows no acute fracture or subluxation. Computer processed images shows no acute fracture or subluxation. Degenerative changes are noted C1-C2 articulation. Mild disc space flattening noted at C5-C6 level. Anterior osteophytes noted lower endplate of C5 and C6 vertebral bodies. Facet degenerative changes noted T1 level.  No prevertebral soft tissue swelling. No pneumothorax in visualized lung apices. Cervical airway is patent.  IMPRESSION: 1. No acute intracranial abnormality. Stable atrophy and chronic white matter disease. 2. Stable subcutaneous soft tissue nodules in left occipital region. 3. No cervical spine acute fracture or subluxation. Degenerative changes as described above.   Electronically Signed   By: Natasha Mead M.D.   On: 05/22/2014 15:39     Mr Brain Wo Contrast (neuro Protocol)  05/22/2014   CLINICAL DATA:  Fall. Altered mental status. Found on floor today. Right upper extremity weakness. Possible stroke.  EXAM: MRI HEAD WITHOUT CONTRAST  TECHNIQUE: Multiplanar, multiecho pulse sequences of the brain and surrounding structures were obtained without intravenous contrast.  COMPARISON:  05/22/2014 PET-CT  FINDINGS: Images are moderately degraded by motion artifact. No acute infarct, intracranial hemorrhage, mass, midline shift, or extra-axial fluid collection is identified. Patchy T2 hyperintensities in the periventricular white matter are nonspecific but compatible with mild chronic small vessel ischemic disease. There is mild generalized cerebral atrophy.   Several soft tissue nodules are again noted in the left occipital scalp measuring 1-2 cm in size, nonspecific. There is likely a small amount of fluid in both maxillary sinuses. Mastoid air cells are grossly clear. Major intracranial vascular flow voids are not well evaluated due to motion.  IMPRESSION: Motion degraded examination without acute intracranial abnormality identified. Mild chronic small vessel ischemic disease.   Electronically Signed   By: Sebastian Ache   On: 05/22/2014 18:05      EKG Interpretation   Date/Time:  Tuesday May 22 2014 14:28:10 EST Ventricular Rate:  106 PR Interval:  147 QRS Duration: 98 QT Interval:  390 QTC Calculation: 518 R Axis:   76 Text Interpretation:  Sinus tachycardia LVH with secondary repolarization  abnormality Anterolateral infarct, acute (LAD) Prolonged QT interval  Artifact in lead(s) I II III aVR aVL aVF No significant change since last  tracing 23 Jul 2013 Confirmed by [facility]  MD-I, Leonore Frankson ([id number]) on 05/22/2014  3:08:30 PM      MDM   Final diagnoses:  Fall  Fall at home, initial encounter  Traumatic rhabdomyolysis, initial encounter  Weakness  Dehydration   Plan admission   Devoria Albe, MD, FACEP   I personally performed the services described in this documentation, which was scribed in my presence. The recorded information has been reviewed  and considered.  Devoria AlbeIva Carol Theys, MD, Armando GangFACEP    Ward GivensIva L Demarkus Remmel, MD 05/22/14 [phone]

## 2014-05-22 NOTE — ED Notes (Signed)
Per EMS, family called for pt to be evaluated due to finding pt in the floor this morning. Pt states she slipped and fell. States she does not know what time it was. Per EMS, pt has a caregiver that comes on during the day but stays by herself during the night

## 2014-05-23 LAB — CBC
HCT: 39.3 % (ref 36.0–46.0)
Hemoglobin: 13.2 g/dL (ref 12.0–15.0)
MCH: 31.4 pg (ref 26.0–34.0)
MCHC: 33.6 g/dL (ref 30.0–36.0)
MCV: 93.6 fL (ref 78.0–100.0)
PLATELETS: 199 10*3/uL (ref 150–400)
RBC: 4.2 MIL/uL (ref 3.87–5.11)
RDW: 12.8 % (ref 11.5–15.5)
WBC: 16 10*3/uL — ABNORMAL HIGH (ref 4.0–10.5)

## 2014-05-23 LAB — COMPREHENSIVE METABOLIC PANEL
ALK PHOS: 69 U/L (ref 39–117)
ALT: 67 U/L — AB (ref 0–35)
AST: 161 U/L — ABNORMAL HIGH (ref 0–37)
Albumin: 3.1 g/dL — ABNORMAL LOW (ref 3.5–5.2)
Anion gap: 17 — ABNORMAL HIGH (ref 5–15)
BILIRUBIN TOTAL: 0.4 mg/dL (ref 0.3–1.2)
BUN: 44 mg/dL — ABNORMAL HIGH (ref 6–23)
CALCIUM: 8.5 mg/dL (ref 8.4–10.5)
CO2: 28 mEq/L (ref 19–32)
Chloride: 104 mEq/L (ref 96–112)
Creatinine, Ser: 1.75 mg/dL — ABNORMAL HIGH (ref 0.50–1.10)
GFR calc non Af Amer: 26 mL/min — ABNORMAL LOW (ref >90)
GFR, EST AFRICAN AMERICAN: 30 mL/min — AB (ref >90)
GLUCOSE: 71 mg/dL (ref 70–99)
POTASSIUM: 2.8 meq/L — AB (ref 3.7–5.3)
Sodium: 149 mEq/L — ABNORMAL HIGH (ref 137–147)
Total Protein: 5.9 g/dL — ABNORMAL LOW (ref 6.0–8.3)

## 2014-05-23 LAB — TSH: TSH: 1.97 u[IU]/mL (ref 0.350–4.500)

## 2014-05-23 LAB — BASIC METABOLIC PANEL
Anion gap: 16 — ABNORMAL HIGH (ref 5–15)
BUN: 46 mg/dL — AB (ref 6–23)
CALCIUM: 8.2 mg/dL — AB (ref 8.4–10.5)
CO2: 26 mEq/L (ref 19–32)
CREATININE: 1.6 mg/dL — AB (ref 0.50–1.10)
Chloride: 104 mEq/L (ref 96–112)
GFR calc non Af Amer: 29 mL/min — ABNORMAL LOW (ref >90)
GFR, EST AFRICAN AMERICAN: 33 mL/min — AB (ref >90)
Glucose, Bld: 76 mg/dL (ref 70–99)
Potassium: 3.5 mEq/L — ABNORMAL LOW (ref 3.7–5.3)
Sodium: 146 mEq/L (ref 137–147)

## 2014-05-23 LAB — CK: Total CK: 3390 U/L — ABNORMAL HIGH (ref 7–177)

## 2014-05-23 LAB — TROPONIN I
Troponin I: <0.3 ng/mL (ref <0.30)
Troponin I: <0.3 ng/mL (ref <0.30)

## 2014-05-23 MED ORDER — POTASSIUM CHLORIDE 10 MEQ/100ML IV SOLN
10.0000 meq | INTRAVENOUS | Status: AC
  Administered 2014-05-23 (×3): 10 meq via INTRAVENOUS
  Filled 2014-05-23 (×3): qty 100

## 2014-05-23 MED ORDER — DEXTROSE 5 % IV SOLN
INTRAVENOUS | Status: AC
  Filled 2014-05-23: qty 10

## 2014-05-23 MED ORDER — KCL IN DEXTROSE-NACL 20-5-0.45 MEQ/L-%-% IV SOLN
INTRAVENOUS | Status: DC
  Administered 2014-05-23 – 2014-05-26 (×9): via INTRAVENOUS

## 2014-05-23 NOTE — Care Management Note (Addendum)
    Page 1 of 1   05/25/2014     3:00:53 PM CARE MANAGEMENT NOTE 05/25/2014  Patient:  Paula SackMARTIN,Paula Norton   Account Number:  [id number]  Date Initiated:  05/23/2014  Documentation initiated by:  Kathyrn SheriffHILDRESS,JESSICA  Subjective/Objective Assessment:   Pt is lethargic and unable to answer questions at the time of assessment. Family is at bedside and inquisitive about placement options. Stated pt is from home, lives alone and is not able to care of herself. They have voiced their converns     Action/Plan:   to the pt but she has not been agreeable to going somewhere else to live. They state they will encourage her to "look at her options" for discharge. Pt had no DME's, HH services or medication needs prior to admission per family.   Anticipated DC Date:  05/26/2014   Anticipated DC Plan:  [facility]  In-house referral  Clinical Social Worker      DC Planning Services  CM consult      Choice offered to / List presented to:             Status of service:  Completed, signed off Medicare Important Message given?  YES (If response is "NO", the following Medicare IM given date fields will be blank) Date Medicare IM given:  05/25/2014 Medicare IM given by:  Anibal HendersonBOLDEN,GENEVA Date Additional Medicare IM given:   Additional Medicare IM given by:    Discharge Disposition:  SKILLED NURSING FACILITY  Per UR Regulation:    If discussed at Long Length of Stay Meetings, dates discussed:    Comments:  05/25/2014 1500 Kathyrn SheriffJessica Childress, RN, MSN, [facility] Per Pt's recommendation pt will be going to SNF at discharge. Family aware and CSW to arrange for discharge. Plan for possible discharge over weekend. No further CM needs at this time.  05/23/2014 1400 Kathyrn SheriffJessica Childress, RN, MSN, PCCN At this time [facility], will have PT eval when appropriate.  CSW made aware of possible need for placement. Many family members at bedside. Spoke with Paula Norton (daughter)  and Paula Norton (nephew), Paula Norton state Paula Norton (daughter, not currently present) is very active in Ms Flinchum's care. Will continue to follow for CM needs.

## 2014-05-23 NOTE — Progress Notes (Signed)
Brief visit for support.

## 2014-05-23 NOTE — Progress Notes (Signed)
CRITICAL VALUE ALERT  Critical value received:  Potassium 2.8  Date of notification:  05/23/14  Time of notification:  0740  Critical value read back:yes  Nurse who received alert: Koleen Nimrod Vinisha Faxon RN  MD notified (1st page):  Dr Renard MatterMcInnis  Time of first page:  [phone] MD notified (2nd page):  Time of second page:  Responding MD: Dr Renard MatterMcinnis  Time MD responded:  [phone]

## 2014-05-23 NOTE — Progress Notes (Signed)
Subjective: The patient is more alert today, response to spoken voice. She did have a fall at home prior to admission and had been down for several hours she apparently did not suffer stroke or fractures  Objective: Vital signs in last 24 hours: Temp:  [96.2 F (35.7 C)-98.4 F (36.9 C)] 98.4 F (36.9 C) (11/17 2321) Pulse Rate:  [91-112] 100 (11/17 2321) Resp:  [11-20] 20 (11/17 2321) BP: (128-160)/(47-92) 135/47 mmHg (11/17 2321) SpO2:  [97 %-100 %] 98 % (11/17 2321) Weight change:  Last BM Date: 05/22/14 (patient reported )  Intake/Output from previous day: 11/17 0701 - 11/18 0700 In: 1000 [I.V.:1000] Out: 8 [Urine:8] Intake/Output this shift:    Physical Exam: General appearance the patient is alert and cooperative  HEENT negative  Neck supple no JVD or thyroid abnormalities  Heart regular rhythm no murmurs  Lungs clear to P&A  Abdomen soft no palpable organs or masses  Skin no edema or other abnormalities  Musculoskeletal grossly normal   Recent Labs  05/22/14 1416  WBC 18.7*  HGB 15.2*  HCT 44.5  PLT 217   BMET  Recent Labs  05/22/14 1416  NA 145  K 3.0*  CL 95*  CO2 28  GLUCOSE 111*  BUN 35*  CREATININE 1.70*  CALCIUM 10.0    Studies/Results: Ct Head Wo Contrast  05/22/2014   CLINICAL DATA:  Fall, slipped on floor  EXAM: CT HEAD WITHOUT CONTRAST  CT CERVICAL SPINE WITHOUT CONTRAST  TECHNIQUE: Multidetector CT imaging of the head and cervical spine was performed following the standard protocol without intravenous contrast. Multiplanar CT image reconstructions of the cervical spine were also generated.  COMPARISON:  07/23/2013  FINDINGS: CT HEAD FINDINGS  No skull fracture is noted. Nodular subcutaneous soft tissue density left occipital region subcutaneous axial image 8 measures 1.3 cm stable in size in appearance from prior exam.  Stable atrophy and chronic white matter disease. No intracranial hemorrhage, mass effect or midline shift. No  acute cortical infarction. Punctate calcification in left basal ganglia again noted. No mass lesion is noted on this unenhanced scan. Mild mucosal thickening bilateral sphenoid sinus.  CT CERVICAL SPINE FINDINGS  Axial images of the cervical spine shows no acute fracture or subluxation. Computer processed images shows no acute fracture or subluxation. Degenerative changes are noted C1-C2 articulation. Mild disc space flattening noted at C5-C6 level. Anterior osteophytes noted lower endplate of C5 and C6 vertebral bodies. Facet degenerative changes noted T1 level.  No prevertebral soft tissue swelling. No pneumothorax in visualized lung apices. Cervical airway is patent.  IMPRESSION: 1. No acute intracranial abnormality. Stable atrophy and chronic white matter disease. 2. Stable subcutaneous soft tissue nodules in left occipital region. 3. No cervical spine acute fracture or subluxation. Degenerative changes as described above.   Electronically Signed   By: Natasha MeadLiviu  Pop M.D.   On: 05/22/2014 15:39   Ct Cervical Spine Wo Contrast  05/22/2014   CLINICAL DATA:  Fall, slipped on floor  EXAM: CT HEAD WITHOUT CONTRAST  CT CERVICAL SPINE WITHOUT CONTRAST  TECHNIQUE: Multidetector CT imaging of the head and cervical spine was performed following the standard protocol without intravenous contrast. Multiplanar CT image reconstructions of the cervical spine were also generated.  COMPARISON:  07/23/2013  FINDINGS: CT HEAD FINDINGS  No skull fracture is noted. Nodular subcutaneous soft tissue density left occipital region subcutaneous axial image 8 measures 1.3 cm stable in size in appearance from prior exam.  Stable atrophy and chronic white matter disease.  No intracranial hemorrhage, mass effect or midline shift. No acute cortical infarction. Punctate calcification in left basal ganglia again noted. No mass lesion is noted on this unenhanced scan. Mild mucosal thickening bilateral sphenoid sinus.  CT CERVICAL SPINE FINDINGS   Axial images of the cervical spine shows no acute fracture or subluxation. Computer processed images shows no acute fracture or subluxation. Degenerative changes are noted C1-C2 articulation. Mild disc space flattening noted at C5-C6 level. Anterior osteophytes noted lower endplate of C5 and C6 vertebral bodies. Facet degenerative changes noted T1 level.  No prevertebral soft tissue swelling. No pneumothorax in visualized lung apices. Cervical airway is patent.  IMPRESSION: 1. No acute intracranial abnormality. Stable atrophy and chronic white matter disease. 2. Stable subcutaneous soft tissue nodules in left occipital region. 3. No cervical spine acute fracture or subluxation. Degenerative changes as described above.   Electronically Signed   By: Natasha MeadLiviu  Pop M.D.   On: 05/22/2014 15:39   Mr Brain Wo Contrast (neuro Protocol)  05/22/2014   CLINICAL DATA:  Fall. Altered mental status. Found on floor today. Right upper extremity weakness. Possible stroke.  EXAM: MRI HEAD WITHOUT CONTRAST  TECHNIQUE: Multiplanar, multiecho pulse sequences of the brain and surrounding structures were obtained without intravenous contrast.  COMPARISON:  05/22/2014 PET-CT  FINDINGS: Images are moderately degraded by motion artifact. No acute infarct, intracranial hemorrhage, mass, midline shift, or extra-axial fluid collection is identified. Patchy T2 hyperintensities in the periventricular white matter are nonspecific but compatible with mild chronic small vessel ischemic disease. There is mild generalized cerebral atrophy.  Several soft tissue nodules are again noted in the left occipital scalp measuring 1-2 cm in size, nonspecific. There is likely a small amount of fluid in both maxillary sinuses. Mastoid air cells are grossly clear. Major intracranial vascular flow voids are not well evaluated due to motion.  IMPRESSION: Motion degraded examination without acute intracranial abnormality identified. Mild chronic small vessel  ischemic disease.   Electronically Signed   By: Sebastian AcheAllen  Grady   On: 05/22/2014 18:05    Medications:  . allopurinol  100 mg Oral QPM  . antiseptic oral rinse  7 mL Mouth Rinse BID  . aspirin EC  81 mg Oral Daily  . cefTRIAXone (ROCEPHIN)  IV  1 g Intravenous Q24H  . enoxaparin (LOVENOX) injection  30 mg Subcutaneous Q24H  . levothyroxine  75 mcg Oral QAC breakfast    . sodium chloride 125 mL/hr at 05/22/14 2106     Assessment/Plan: 1. Weakness possible rhabdomyolysis-dehydration-continue to rehydrate  2. Probable UTI will continue IV Rocephin  3. Acute renal failure with elevated creatinine will continue to monitor  4. Low serum potassium will recheck and replete   LOS: 1 day   Niamh Rada G 05/23/2014, 6:35 AM

## 2014-05-23 NOTE — Care Management Utilization Note (Signed)
UR review complete.  

## 2014-05-23 NOTE — Progress Notes (Signed)
Nutrition Brief Note  Patient identified on the Malnutrition Screening Tool (MST) Report  Wt Readings from Last 15 Encounters:  07/23/13 167 lb (75.751 kg)  05/03/13 161 lb (73.029 kg)  01/04/13 168 lb (76.204 kg)   Pt weight hx without significant changes according to our records.  Current diet order is Heart Healthy  patient is consuming approximately n/a% of meals at this time. Labs and medications reviewed.   No nutrition interventions warranted at this time. If nutrition issues arise, please consult RD.   Royann ShiversLynn Anthonie Lotito MS,RD,CSG,LDN Office: [phone] Pager: [phone]

## 2014-05-23 NOTE — Progress Notes (Signed)
SLP Cancellation Note  Patient Details Name: Paula Norton MRN: [id number] DOB: 08/14/1929   Cancelled treatment:       Reason Eval/Treat Not Completed: Other (comment);Fatigue/lethargy limiting ability to participate; pt unable this AM and SLP unable to see this afternoon due to scheduling conflict. Will follow 05/23/2014 AM.  Thank you,  Havery Morosabney Jennings Corado, CCC-SLP [phone]    Angelly Spearing 05/23/2014, 3:34 PM

## 2014-05-24 MED ORDER — CEFTRIAXONE SODIUM 1 G IJ SOLR
INTRAMUSCULAR | Status: AC
  Filled 2014-05-24: qty 10

## 2014-05-24 NOTE — Progress Notes (Signed)
Subjective: The patient is more alert today. She did have a fall at home prior to admission and had been down for several hours did not suffer stroke or fractures but was felt to be dehydrated and had mild rhabdomyolysis  Objective: Vital signs in last 24 hours: Temp:  [98.1 F (36.7 C)-100.7 F (38.2 C)] 98.8 F (37.1 C) (11/19 0548) Pulse Rate:  [87-106] 87 (11/19 0548) Resp:  [20] 20 (11/19 0548) BP: (138-157)/(49-69) 150/69 mmHg (11/19 0548) SpO2:  [92 %-99 %] 97 % (11/19 0548) Weight:  [60.1 kg (132 lb 7.9 oz)] 60.1 kg (132 lb 7.9 oz) (11/18 1100) Weight change:  Last BM Date: 05/22/14  Intake/Output from previous day: 11/18 0701 - 11/19 0700 In: 2158.3 [I.V.:2058.3; IV Piggyback:100] Out: 3 [Urine:2; Stool:1] Intake/Output this shift: Total I/O In: 2158.3 [I.V.:2058.3; IV Piggyback:100] Out: 2 [Urine:1; Stool:1]  Physical Exam: Gen. appearance the patient is alert and cooperative  HEENT negative  Neck supple no JVD or thyroid abnormalities  Heart regular rhythm no murmurs  Lungs clear to P&A  Abdomen soft no palpable organs or masses  Skin warm and dry no other abnormalities  Musculoskeletal grossly normal   Recent Labs  05/22/14 1416 05/23/14 0552  WBC 18.7* 16.0*  HGB 15.2* 13.2  HCT 44.5 39.3  PLT 217 199   BMET  Recent Labs  05/23/14 0552 05/23/14 1208  NA 149* 146  K 2.8* 3.5*  CL 104 104  CO2 28 26  GLUCOSE 71 76  BUN 44* 46*  CREATININE 1.75* 1.60*  CALCIUM 8.5 8.2*    Studies/Results: Ct Head Wo Contrast  05/22/2014   CLINICAL DATA:  Fall, slipped on floor  EXAM: CT HEAD WITHOUT CONTRAST  CT CERVICAL SPINE WITHOUT CONTRAST  TECHNIQUE: Multidetector CT imaging of the head and cervical spine was performed following the standard protocol without intravenous contrast. Multiplanar CT image reconstructions of the cervical spine were also generated.  COMPARISON:  07/23/2013  FINDINGS: CT HEAD FINDINGS  No skull fracture is noted. Nodular  subcutaneous soft tissue density left occipital region subcutaneous axial image 8 measures 1.3 cm stable in size in appearance from prior exam.  Stable atrophy and chronic white matter disease. No intracranial hemorrhage, mass effect or midline shift. No acute cortical infarction. Punctate calcification in left basal ganglia again noted. No mass lesion is noted on this unenhanced scan. Mild mucosal thickening bilateral sphenoid sinus.  CT CERVICAL SPINE FINDINGS  Axial images of the cervical spine shows no acute fracture or subluxation. Computer processed images shows no acute fracture or subluxation. Degenerative changes are noted C1-C2 articulation. Mild disc space flattening noted at C5-C6 level. Anterior osteophytes noted lower endplate of C5 and C6 vertebral bodies. Facet degenerative changes noted T1 level.  No prevertebral soft tissue swelling. No pneumothorax in visualized lung apices. Cervical airway is patent.  IMPRESSION: 1. No acute intracranial abnormality. Stable atrophy and chronic white matter disease. 2. Stable subcutaneous soft tissue nodules in left occipital region. 3. No cervical spine acute fracture or subluxation. Degenerative changes as described above.   Electronically Signed   By: Natasha MeadLiviu  Pop M.D.   On: 05/22/2014 15:39   Ct Cervical Spine Wo Contrast  05/22/2014   CLINICAL DATA:  Fall, slipped on floor  EXAM: CT HEAD WITHOUT CONTRAST  CT CERVICAL SPINE WITHOUT CONTRAST  TECHNIQUE: Multidetector CT imaging of the head and cervical spine was performed following the standard protocol without intravenous contrast. Multiplanar CT image reconstructions of the cervical spine were also generated.  COMPARISON:  07/23/2013  FINDINGS: CT HEAD FINDINGS  No skull fracture is noted. Nodular subcutaneous soft tissue density left occipital region subcutaneous axial image 8 measures 1.3 cm stable in size in appearance from prior exam.  Stable atrophy and chronic white matter disease. No intracranial  hemorrhage, mass effect or midline shift. No acute cortical infarction. Punctate calcification in left basal ganglia again noted. No mass lesion is noted on this unenhanced scan. Mild mucosal thickening bilateral sphenoid sinus.  CT CERVICAL SPINE FINDINGS  Axial images of the cervical spine shows no acute fracture or subluxation. Computer processed images shows no acute fracture or subluxation. Degenerative changes are noted C1-C2 articulation. Mild disc space flattening noted at C5-C6 level. Anterior osteophytes noted lower endplate of C5 and C6 vertebral bodies. Facet degenerative changes noted T1 level.  No prevertebral soft tissue swelling. No pneumothorax in visualized lung apices. Cervical airway is patent.  IMPRESSION: 1. No acute intracranial abnormality. Stable atrophy and chronic white matter disease. 2. Stable subcutaneous soft tissue nodules in left occipital region. 3. No cervical spine acute fracture or subluxation. Degenerative changes as described above.   Electronically Signed   By: Natasha MeadLiviu  Pop M.D.   On: 05/22/2014 15:39   Mr Brain Wo Contrast (neuro Protocol)  05/22/2014   CLINICAL DATA:  Fall. Altered mental status. Found on floor today. Right upper extremity weakness. Possible stroke.  EXAM: MRI HEAD WITHOUT CONTRAST  TECHNIQUE: Multiplanar, multiecho pulse sequences of the brain and surrounding structures were obtained without intravenous contrast.  COMPARISON:  05/22/2014 PET-CT  FINDINGS: Images are moderately degraded by motion artifact. No acute infarct, intracranial hemorrhage, mass, midline shift, or extra-axial fluid collection is identified. Patchy T2 hyperintensities in the periventricular white matter are nonspecific but compatible with mild chronic small vessel ischemic disease. There is mild generalized cerebral atrophy.  Several soft tissue nodules are again noted in the left occipital scalp measuring 1-2 cm in size, nonspecific. There is likely a small amount of fluid in both  maxillary sinuses. Mastoid air cells are grossly clear. Major intracranial vascular flow voids are not well evaluated due to motion.  IMPRESSION: Motion degraded examination without acute intracranial abnormality identified. Mild chronic small vessel ischemic disease.   Electronically Signed   By: Sebastian AcheAllen  Grady   On: 05/22/2014 18:05    Medications:  . allopurinol  100 mg Oral QPM  . antiseptic oral rinse  7 mL Mouth Rinse BID  . aspirin EC  81 mg Oral Daily  . cefTRIAXone (ROCEPHIN)  IV  1 g Intravenous Q24H  . enoxaparin (LOVENOX) injection  30 mg Subcutaneous Q24H  . levothyroxine  75 mcg Oral QAC breakfast    . sodium chloride 125 mL/hr at 05/22/14 2106  . dextrose 5 % and 0.45 % NaCl with KCl 20 mEq/L 125 mL/hr at 05/24/14 0518     Assessment/Plan: 1. Prior fall at home weakness rhabdomyolysis-dehydration-plan to continue to rehydrate will gradually ambulate  2. Probable UTI will continue IV Rocephin  3. Acute renal failure with elevated creatinine we'll Continue to monitor chemistries  Low serum potassium level repleted   LOS: 2 days   Brae Schaafsma G 05/24/2014, 6:23 AM

## 2014-05-24 NOTE — Evaluation (Signed)
Clinical/Bedside Swallow Evaluation Patient Details  Name: Paula Norton MRN: [id number] DOB: 12/05/1929  Today's Date: 05/24/2014 Time:11:55 AM  - 12:14 PM    Past Medical History:  Past Medical History  Diagnosis Date  . Hypertension   . Thyroid disease    Past Surgical History:  Past Surgical History  Procedure Laterality Date  . Cholecystectomy  2010    Gall Bladder   HPI:  Paula Norton Mervin is a 80 y.o. female an 80 year old lady who apparently was found on her floor at home, possibly having spent several hours there. The patient is somewhat confused when she came to the emergency room.she apparently had a fall yesterday. The family found her lying on the floor this morning when she did not answer her phone after several attempts. She apparently had been on the floor since 4 PM yesterday. Evaluation emergency room shows that she is dehydrated and has mild rhabdomyolysis. MRI brain scan does not show any evidence of stroke. She is now being admitted for further management.   Assessment/Recommendations/Treatment Plan    SLP Assessment Clinical Impression Statement: Ms. Paula Norton shows no overt signs or symptoms of aspiration at bedside. She was not alert yesterday and could not hold the bolus orally. She is much better today. Recommend D3 (due to poor dentition) and thin liquids with standard aspiration and reflux precautions. No further SLP follow up indicated at this time. Above to pt, family, and Charity fundraiserN.  Risk for Aspiration: None  Swallow Evaluation Recommendations Diet Recommendations: Dysphagia 3 (Mechanical Soft), Thin liquid Liquid Administration via: Cup, Straw Medication Administration: Whole meds with liquid Supervision: Patient able to self feed Compensations: Slow rate Postural Changes and/or Swallow Maneuvers: Seated upright 90 degrees, Upright 30-60 min after meal Oral Care Recommendations: Oral care BID Other Recommendations: Clarify dietary restrictions Follow up  Recommendations: None  Treatment Plan Treatment Plan Recommendations: No treatment recommended at this time  Prognosis Prognosis for Safe Diet Advancement: Good  Individuals Consulted Consulted and Agree with Results and Recommendations: Patient, Family member/caregiver, RN  Swallow Study Prior Functional Status   Lives at home  General  Date of Onset: 05/22/14 HPI: Paula Norton is a 80 y.o. female an 80 year old lady who apparently was found on her floor at home, possibly having spent several hours there. The patient is somewhat confused when she came to the emergency room.she apparently had a fall yesterday. The family found her lying on the floor this morning when she did not answer her phone after several attempts. She apparently had been on the floor since 4 PM yesterday. Evaluation emergency room shows that she is dehydrated and has mild rhabdomyolysis. MRI brain scan does not show any evidence of stroke. She is now being admitted for further management. Type of Study: Bedside swallow evaluation Diet Prior to this Study: NPO Temperature Spikes Noted: No Respiratory Status: Room air History of Recent Intubation: No Behavior/Cognition: Alert, Cooperative, Pleasant mood Oral Cavity - Dentition: Poor condition Self-Feeding Abilities: Able to feed self Patient Positioning: Upright in bed Baseline Vocal Quality: Clear Volitional Cough: Strong Volitional Swallow: Able to elicit  Oral Motor/Sensory Function  Overall Oral Motor/Sensory Function: Appears within functional limits for tasks assessed Labial ROM: Within Functional Limits Labial Symmetry: Within Functional Limits Labial Strength: Within Functional Limits Labial Sensation: Within Functional Limits Lingual ROM: Within Functional Limits Lingual Symmetry: Within Functional Limits Lingual Strength: Within Functional Limits Lingual Sensation: Within Functional Limits Facial ROM: Within Functional Limits Facial Symmetry:  Within Functional Limits Facial Strength:  Within Functional Limits Facial Sensation: Within Functional Limits Velum: Within Functional Limits Mandible: Within Functional Limits  Consistency Results  Ice Chips Ice chips: Within functional limits Presentation: Spoon  Thin Liquid Thin Liquid: Within functional limits Presentation: Straw;Cup;Self Fed  Nectar Thick Liquid Nectar Thick Liquid: Not tested  Honey Thick Liquid Honey Thick Liquid: Not tested  Puree Puree: Within functional limits Presentation: Spoon  Solid Solid: Within functional limits Presentation: Self Fed Other Comments: upper dentition in poor repair, benefited from liquid wash to clear oral cavity with dry crackers.  Thank you,  Havery MorosDabney Porter, CCC-SLP [phone]  PORTER,DABNEY 05/24/2014,12:25 PM

## 2014-05-24 NOTE — Progress Notes (Signed)
SLP Cancellation Note  Patient Details Name: Paula Norton MRN: [id number] DOB: 01/19/1930   Cancelled treatment:       Reason Eval/Treat Not Completed: Other (comment); Pt getting bathed at this time. Will see another pt and come back to complete clinical swallow evaluation.  Thank you,  Havery MorosDabney Porter, CCC-SLP [phone]    PORTER,DABNEY 05/24/2014, 10:38 AM

## 2014-05-25 MED ORDER — LORAZEPAM 2 MG/ML IJ SOLN
0.5000 mg | INTRAMUSCULAR | Status: DC | PRN
  Administered 2014-05-25: 0.5 mg via INTRAVENOUS
  Filled 2014-05-25: qty 1

## 2014-05-25 MED ORDER — LORAZEPAM 0.5 MG PO TABS: 0.5000 mg | ORAL_TABLET | ORAL | Status: DC | PRN

## 2014-05-25 NOTE — Clinical Social Work Psychosocial (Signed)
Clinical Social Work Department BRIEF PSYCHOSOCIAL ASSESSMENT 05/25/2014  Patient:  Paula Norton, Paula Norton     Account Number:  [id number]     Admit date:  05/22/2014  Clinical Social Worker:  Wyatt Haste  Date/Time:  05/25/2014 03:31 PM  Referred by:  CSW  Date Referred:  05/25/2014 Referred for  SNF Placement   Other Referral:   Interview type:  Patient Other interview type:   nieces and step-son    PSYCHOSOCIAL DATA Living Status:  ALONE Admitted from facility:   Level of care:   Primary support name:  Gladys Primary support relationship to patient:  FAMILY Degree of support available:   supportive per pt    CURRENT CONCERNS Current Concerns  Post-Acute Placement   Other Concerns:    SOCIAL WORK ASSESSMENT / PLAN CSW met with pt and pt's nieces and step-son at bedside. Pt reports she lives alone and has family who check on her about daily. She has nieces and nephews that live nearby in [place] and her step-son lives in [place]. Regino Schultze reports that pt has been weak for some time now and continues to decline. She generally ambulates using a walker or cane and uses a wheelchair for longer distances. PT evaluated pt today and recommendation is for SNF. Pt states therapy was "not too bad, not too good." She appears to recognize that she cannot return to live alone right now.  CSW discussed placement process and provided SNF list. Pt agreeable to [facility], [facility] or [facility]. CSW initiated bed search.   Assessment/plan status:  Psychosocial Support/Ongoing Assessment of Needs Other assessment/ plan:   Information/referral to community resources:   SNF list    PATIENT'S/FAMILY'S RESPONSE TO PLAN OF CARE: Pt agreeable to SNF, but is clear she only intends to stay short term. Family definitely feel this is needed. CSW will follow up with bed offers.       Benay Pike, [place]

## 2014-05-25 NOTE — Clinical Social Work Placement (Signed)
Clinical Social Work Department CLINICAL SOCIAL WORK PLACEMENT NOTE 05/25/2014  Patient:  Paula Norton,Paula Norton  Account Number:  [id number] Admit date:  05/22/2014  Clinical Social Worker:  Leno Mathes, LCSW  Date/time:  05/25/2014 03:28 PM  Clinical Social Work is seeking post-discharge placement for this patient at the following level of care:   SKILLED NURSING   (*CSW will update this form in Epic as items are completed)   05/25/2014  Patient/family provided with [facility] Department of Clinical Social Work's list of facilities offering this level of care within the geographic area requested by the patient (or if unable, by the patient's family).  05/25/2014  Patient/family informed of their freedom to choose among providers that offer the needed level of care, that participate in Medicare, Medicaid or managed care program needed by the patient, have an available bed and are willing to accept the patient.  05/25/2014  Patient/family informed of [facility]' ownership interest in [facility], as well as of the fact that they are under no obligation to receive care at this facility.  PASARR submitted to EDS on 05/25/2014 PASARR number received on 05/25/2014  FL2 transmitted to all facilities in geographic area requested by pt/family on  05/25/2014 FL2 transmitted to all facilities within larger geographic area on   Patient informed that his/her managed care company has contracts with or will negotiate with  certain facilities, including the following:     Patient/family informed of bed offers received:  05/25/2014 Patient chooses bed at [facility] Physician recommends and patient chooses bed at  [facility]  Patient to be transferred to [facility] on   Patient to be transferred to facility by  Patient and family notified of transfer on  Name of family member notified:    The following physician request were entered in Epic:   Additional  Comments:  Demetria Iwai, LCSW [phone] 

## 2014-05-25 NOTE — Clinical Social Work Note (Addendum)
Pt accepts bed at [facility]. Durward FortesStep-son David notified and plans to discuss copays tomorrow with admissions. MD also aware and plans to d/c in AM. Facility notified.   Derenda FennelKara Brittinee Risk, Kentucky[place]CS[place] [phone]

## 2014-05-25 NOTE — Clinical Social Work Placement (Signed)
Clinical Social Work Department CLINICAL SOCIAL WORK PLACEMENT NOTE 05/25/2014  Patient:  Paula Norton,Paula Norton  Account Number:  [id number] Admit date:  05/22/2014  Clinical Social Worker:  Derenda FennelKARA Ahmaad Neidhardt, LCSW  Date/time:  05/25/2014 03:28 PM  Clinical Social Work is seeking post-discharge placement for this patient at the following level of care:   SKILLED NURSING   (*CSW will update this form in Epic as items are completed)   05/25/2014  [facility]/family provided with [facility] Department of Clinical Social Work's list of facilities offering this level of care within the geographic area requested by the patient (or if unable, by the patient's family).  05/25/2014  Patient/family informed of their freedom to choose among providers that offer the needed level of care, that participate in Medicare, Medicaid or managed care program needed by the patient, have an available bed and are willing to accept the patient.  05/25/2014  Patient/family informed of [facility] ownership interest in [facility], as well as of the fact that they are under no obligation to receive care at this facility.  PASARR submitted to EDS on 05/25/2014 PASARR number received on 05/25/2014  FL2 transmitted to all facilities in geographic area requested by pt/family on  05/25/2014 FL2 transmitted to all facilities within larger geographic area on   Patient informed that his/her managed care company has contracts with or will negotiate with  certain facilities, including the following:     Patient/family informed of bed offers received:   Patient chooses bed at  Physician recommends and patient chooses bed at    Patient to be transferred to  on   Patient to be transferred to facility by  Patient and family notified of transfer on  Name of family member notified:    The following physician request were entered in Epic:   Additional Comments:   Derenda FennelKara Lovelee Forner, LCSW [phone]

## 2014-05-25 NOTE — Evaluation (Signed)
Physical Therapy Evaluation Patient Details Name: Paula Norton MRN: [id number] DOB: 08/02/1929 Today's Date: 05/25/2014   History of Present Illness  This is an 80 year old lady who apparently was found on her floor at home, possibly having spent several hours there. The patient is somewhat confused when she came to the emergency room.she apparently had a fall yesterday. The family found her lying on the floor this morning when she did not answer her phone after several attempts. She apparently had been on the floor since 4 PM yesterday. Evaluation emergency room shows that she is dehydrated and has mild rhabdomyolysis. MRI brain scan does not show any evidence of stroke. She is now being admitted for further management.  Clinical Impression  Pt is an 80 year old female who presents to PT with dx of rhabdomyolysis.   Pt had a fall at home, and was found down after a couple of days.  Pt reports she has had 2 falls in the past 6 months.  During evaluation, pt required mo/max assist for bed mobility skills, mod assist for transfers, and was only able to side step today.  Unable to attempt amb secondary to inability to sufficiently lift Rt LE off ground, and increased trunk flexed posturing during standing despite assist and VC to maintain upright posture.  Recommend continued PT in the hospital to address strengthening, activity tolerance, and balance for improved functional mobility skills with transition to SNF at discharge for continued rehab.  Will defer to SNF for rehab.     Follow Up Recommendations SNF    Equipment Recommendations  Other (comment) (Will defer to SNF)       Precautions / Restrictions Precautions Precautions: Fall Restrictions Weight Bearing Restrictions: No      Mobility  Bed Mobility Overal bed mobility: Needs Assistance Bed Mobility: Supine to Sit;Sit to Supine     Supine to sit: Mod assist;Max assist (for trunk) Sit to supine: Mod assist (for LE and trunk)      Transfers Overall transfer level: Needs assistance Equipment used: Rolling walker (2 wheeled) Transfers: Sit to/from Stand Sit to Stand: Mod assist            Ambulation/Gait       Gait Pattern/deviations: Trunk flexed     General Gait Details: Side stepped 3 steps with max VC for technique and for upright posturing.  Pt able to march Lt leg up, though unable to sufficiently lift up Rt LE to attempt gait today.       Balance Overall balance assessment: Needs assistance Sitting-balance support: Feet supported;Bilateral upper extremity supported Sitting balance-Leahy Scale: Fair   Postural control: Posterior lean Standing balance support: Bilateral upper extremity supported;During functional activity Standing balance-Leahy Scale: Poor Standing balance comment: Continuous VC for upright posturing                             Pertinent Vitals/Pain Pain Assessment: No/denies pain    Home Living Family/patient expects to be discharged to:: Skilled nursing facility                 Additional Comments: Pt lives alone in a two story home.  Pt reports her bedroom/bathroom are on first floor.  DME : manual W/C, 4 wheeled walker, std cane, tub shower    Prior Function           Comments: Pt reports mod (I) with bed mobility skills, transfers, and household amb.  Pt  reports she sponge baths.         Extremity/Trunk Assessment               Lower Extremity Assessment: Generalized weakness         Communication   Communication: No difficulties  Cognition Arousal/Alertness: Awake/alert Behavior During Therapy: WFL for tasks assessed/performed Overall Cognitive Status: Within Functional Limits for tasks assessed                       Assessment/Plan    PT Assessment Patient needs continued PT services  PT Diagnosis Difficulty walking;Generalized weakness   PT Problem List Decreased strength;Decreased mobility;Decreased  activity tolerance  PT Treatment Interventions Therapeutic exercise;Gait training;Functional mobility training;Therapeutic activities   PT Goals (Current goals can be found in the Care Plan section) Acute Rehab PT Goals Patient Stated Goal: go home PT Goal Formulation: With patient Time For Goal Achievement: 06/08/14 Potential to Achieve Goals: Fair    Frequency Min 3X/week   Barriers to discharge Decreased caregiver support;Inaccessible home environment         End of Session Equipment Utilized During Treatment: Gait belt Activity Tolerance: Patient limited by fatigue Patient left: in bed;with call bell/phone within reach;with bed alarm set;with family/visitor present           Time: [id number] PT Time Calculation (min) (ACUTE ONLY): 30 min   Charges:   PT Evaluation $Initial PT Evaluation Tier I: 1 Procedure PT Treatments $Therapeutic Activity: 8-22 mins   Urban Naval 05/25/2014, 2:44 PM

## 2014-05-26 NOTE — Plan of Care (Signed)
Problem: Phase I Progression Outcomes Goal: Pain controlled with appropriate interventions Outcome: Completed/Met Date Met:  05/26/14     

## 2014-05-26 NOTE — Discharge Summary (Signed)
Physician Discharge Summary  Paula Norton ZOX:[id number] DOB: 06-02-1930 DOA: 05/22/2014  PCP: Alice Reichert, MD  Admit date: 05/22/2014 Discharge date: 05/26/2014     Discharge Diagnoses:  1. Fall weakness 2. Rhabdomyolysis 3. Acute renal failure CK D stage III 4. Hypertension essential 5. Hyperuricemia       Discharge Condition: Stable Disposition: Patient discharged to nursing facility  Diet recommendation: Low-sodium diet  Filed Weights   05/23/14 1100 05/26/14 0612  Weight: 60.1 kg (132 lb 7.9 oz) 64.275 kg (141 lb 11.2 oz)    History of present illness:  The patient was admitted to the hospital through the ED. She was brought there by EMS was found on the floor at home having spent several hours on the floor and was somewhat confused in emergency facility. She noted be mildly dehydrated and had mild rhabdomyolysis. MRI brain scan did not show stroke. She was admitted for further evaluation.  Hospital Course:  The patient was started on intravenous fluids and this was continued. She was empirically started on IV Rocephin for possible urinary tract infection. She was continued on the medications listed below. X-rays were obtained and there was essentially negative MRI scan did not show evidence of stroke. She was continued on medications listed below. She became much more alert and oriented. She had been having some difficulty walking and was felt she would benefit from a period of time in nursing facility primarily for rehabilitation.   Discharge Instructions     Medication List    TAKE these medications        alendronate 70 MG tablet  Commonly known as:  FOSAMAX  Take 70 mg by mouth once a week.     allopurinol 100 MG tablet  Commonly known as:  ZYLOPRIM  Take 100 mg by mouth every evening.     aspirin EC 81 MG tablet  Take 81 mg by mouth daily.     cephALEXin 500 MG capsule  Commonly known as:  KEFLEX  Take 1 capsule (500 mg total) by mouth 4  (four) times daily.     diazepam 5 MG tablet  Commonly known as:  VALIUM  Take 5 mg by mouth at bedtime as needed (sleep).     DIOVAN 80 MG tablet  Generic drug:  valsartan  Take 80 mg by mouth daily.     levothyroxine 75 MCG tablet  Commonly known as:  SYNTHROID, LEVOTHROID  Take 75 mcg by mouth daily.     niacin 500 MG CR tablet  Commonly known as:  NIASPAN  Take 500 mg by mouth at bedtime.     simvastatin 40 MG tablet  Commonly known as:  ZOCOR  Take 40 mg by mouth every evening.       No Known Allergies  The results of significant diagnostics from this hospitalization (including imaging, microbiology, ancillary and laboratory) are listed below for reference.    Significant Diagnostic Studies: Ct Head Wo Contrast  05/22/2014   CLINICAL DATA:  Fall, slipped on floor  EXAM: CT HEAD WITHOUT CONTRAST  CT CERVICAL SPINE WITHOUT CONTRAST  TECHNIQUE: Multidetector CT imaging of the head and cervical spine was performed following the standard protocol without intravenous contrast. Multiplanar CT image reconstructions of the cervical spine were also generated.  COMPARISON:  07/23/2013  FINDINGS: CT HEAD FINDINGS  No skull fracture is noted. Nodular subcutaneous soft tissue density left occipital region subcutaneous axial image 8 measures 1.3 cm stable in size in appearance from prior exam.  Stable atrophy and chronic white matter disease. No intracranial hemorrhage, mass effect or midline shift. No acute cortical infarction. Punctate calcification in left basal ganglia again noted. No mass lesion is noted on this unenhanced scan. Mild mucosal thickening bilateral sphenoid sinus.  CT CERVICAL SPINE FINDINGS  Axial images of the cervical spine shows no acute fracture or subluxation. Computer processed images shows no acute fracture or subluxation. Degenerative changes are noted C1-C2 articulation. Mild disc space flattening noted at C5-C6 level. Anterior osteophytes noted lower endplate of C5  and C6 vertebral bodies. Facet degenerative changes noted T1 level.  No prevertebral soft tissue swelling. No pneumothorax in visualized lung apices. Cervical airway is patent.  IMPRESSION: 1. No acute intracranial abnormality. Stable atrophy and chronic white matter disease. 2. Stable subcutaneous soft tissue nodules in left occipital region. 3. No cervical spine acute fracture or subluxation. Degenerative changes as described above.   Electronically Signed   By: Natasha MeadLiviu  Pop M.D.   On: 05/22/2014 15:39   Ct Cervical Spine Wo Contrast  05/22/2014   CLINICAL DATA:  Fall, slipped on floor  EXAM: CT HEAD WITHOUT CONTRAST  CT CERVICAL SPINE WITHOUT CONTRAST  TECHNIQUE: Multidetector CT imaging of the head and cervical spine was performed following the standard protocol without intravenous contrast. Multiplanar CT image reconstructions of the cervical spine were also generated.  COMPARISON:  07/23/2013  FINDINGS: CT HEAD FINDINGS  No skull fracture is noted. Nodular subcutaneous soft tissue density left occipital region subcutaneous axial image 8 measures 1.3 cm stable in size in appearance from prior exam.  Stable atrophy and chronic white matter disease. No intracranial hemorrhage, mass effect or midline shift. No acute cortical infarction. Punctate calcification in left basal ganglia again noted. No mass lesion is noted on this unenhanced scan. Mild mucosal thickening bilateral sphenoid sinus.  CT CERVICAL SPINE FINDINGS  Axial images of the cervical spine shows no acute fracture or subluxation. Computer processed images shows no acute fracture or subluxation. Degenerative changes are noted C1-C2 articulation. Mild disc space flattening noted at C5-C6 level. Anterior osteophytes noted lower endplate of C5 and C6 vertebral bodies. Facet degenerative changes noted T1 level.  No prevertebral soft tissue swelling. No pneumothorax in visualized lung apices. Cervical airway is patent.  IMPRESSION: 1. No acute  intracranial abnormality. Stable atrophy and chronic white matter disease. 2. Stable subcutaneous soft tissue nodules in left occipital region. 3. No cervical spine acute fracture or subluxation. Degenerative changes as described above.   Electronically Signed   By: Natasha MeadLiviu  Pop M.D.   On: 05/22/2014 15:39   Mr Brain Wo Contrast (neuro Protocol)  05/22/2014   CLINICAL DATA:  Fall. Altered mental status. Found on floor today. Right upper extremity weakness. Possible stroke.  EXAM: MRI HEAD WITHOUT CONTRAST  TECHNIQUE: Multiplanar, multiecho pulse sequences of the brain and surrounding structures were obtained without intravenous contrast.  COMPARISON:  05/22/2014 PET-CT  FINDINGS: Images are moderately degraded by motion artifact. No acute infarct, intracranial hemorrhage, mass, midline shift, or extra-axial fluid collection is identified. Patchy T2 hyperintensities in the periventricular white matter are nonspecific but compatible with mild chronic small vessel ischemic disease. There is mild generalized cerebral atrophy.  Several soft tissue nodules are again noted in the left occipital scalp measuring 1-2 cm in size, nonspecific. There is likely a small amount of fluid in both maxillary sinuses. Mastoid air cells are grossly clear. Major intracranial vascular flow voids are not well evaluated due to motion.  IMPRESSION: Motion degraded examination without  acute intracranial abnormality identified. Mild chronic small vessel ischemic disease.   Electronically Signed   By: Sebastian AcheAllen  Grady   On: 05/22/2014 18:05    Microbiology: No results found for this or any previous visit (from the past 240 hour(s)).   Labs: Basic Metabolic Panel:  Recent Labs Lab 05/22/14 1416 05/23/14 0552 05/23/14 1208  NA 145 149* 146  K 3.0* 2.8* 3.5*  CL 95* 104 104  CO2 28 28 26   GLUCOSE 111* 71 76  BUN 35* 44* 46*  CREATININE 1.70* 1.75* 1.60*  CALCIUM 10.0 8.5 8.2*   Liver Function Tests:  Recent Labs Lab  05/22/14 1416 05/23/14 0552  AST 251* 161*  ALT 83* 67*  ALKPHOS 83 69  BILITOT 1.1 0.4  PROT 7.4 5.9*  ALBUMIN 4.1 3.1*   No results for input(s): LIPASE, AMYLASE in the last 168 hours. No results for input(s): AMMONIA in the last 168 hours. CBC:  Recent Labs Lab 05/22/14 1416 05/23/14 0552  WBC 18.7* 16.0*  NEUTROABS 17.1*  --   HGB 15.2* 13.2  HCT 44.5 39.3  MCV 93.5 93.6  PLT 217 199   Cardiac Enzymes:  Recent Labs Lab 05/22/14 1416 05/22/14 2000 05/23/14 0026 05/23/14 0552  CKTOTAL 7542*  --   --  3390*  TROPONINI <0.30 <0.30 <0.30 <0.30   BNP: BNP (last 3 results) No results for input(s): PROBNP in the last 8760 hours. CBG: No results for input(s): GLUCAP in the last 168 hours.  Active Problems:   Rhabdomyolysis   ARF (acute renal failure)   Weakness   HTN (hypertension)   Time coordinating discharge: 30 minutes Signed:  Butch PennyAngus Ennis Heavner, MD 05/26/2014, 9:25 AM

## 2014-05-26 NOTE — Progress Notes (Signed)
Report called to [facility]. 

## 2014-05-28 NOTE — Care Management Utilization Note (Signed)
UR review complete.  

## 2015-03-02 IMAGING — CT CT CERVICAL SPINE W/O CM
4 of 5 series · 15 of 33 positions shown, 17 images · non-contrast
Comparison: 07/23/2013

CLINICAL DATA: Fall, slipped on floor

EXAM:
CT HEAD WITHOUT CONTRAST
CT CERVICAL SPINE WITHOUT CONTRAST
TECHNIQUE: Multidetector CT imaging of the head and cervical spine was
performed following the standard protocol without intravenous
contrast. Multiplanar CT image reconstructions of the cervical spine
were also generated.

[Series 5: cervical st 2.0 b31s · axial · 0.26mm/px · z∈[+22,+118]mm · 4 of 82 slices shown, 5 images]
[im 17/82  soft-tissue]
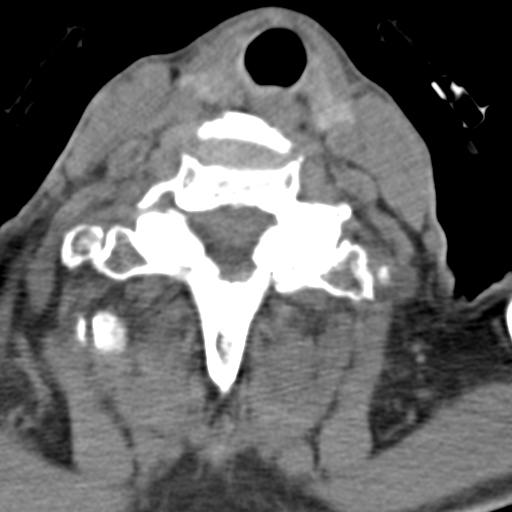
[im 17/82  bone]
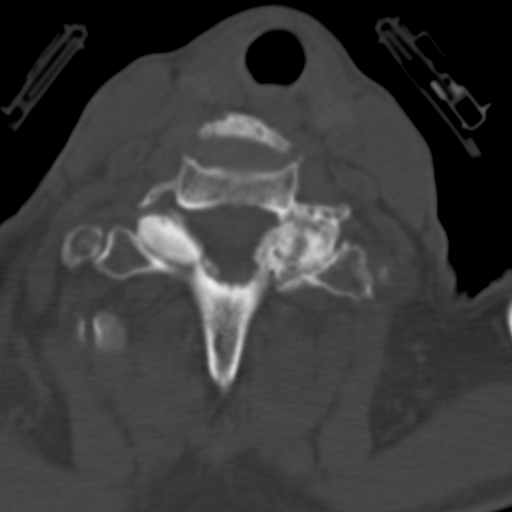
[im 33/82  bone]
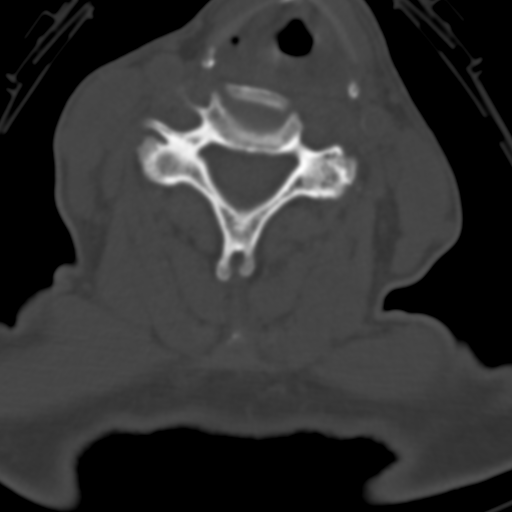
[im 49/82  bone]
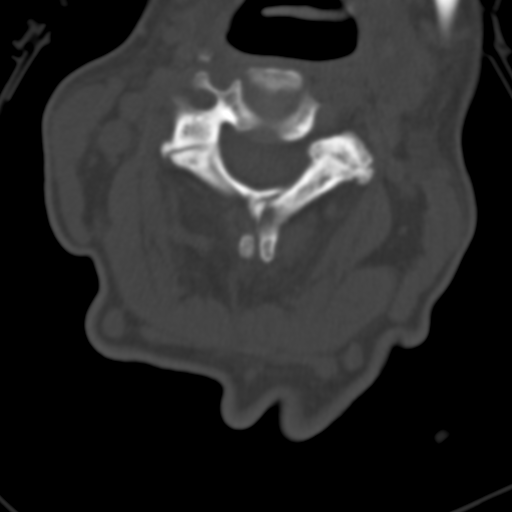
[im 65/82  bone]
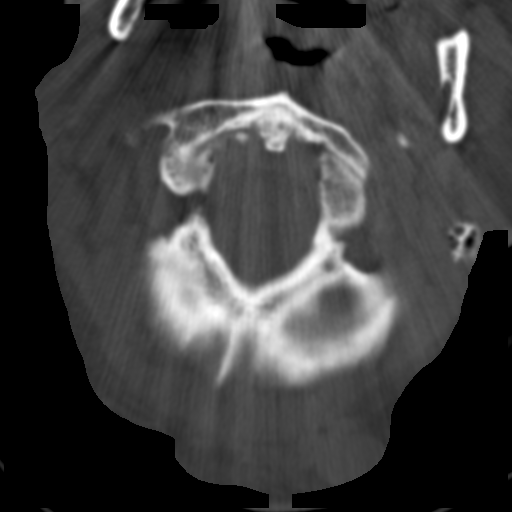

[Series 7: sagittal bone 2.0 · sagittal · 0.27mm/px · 5 of 60 slices shown, 6 images]
[im 20/60  bone]
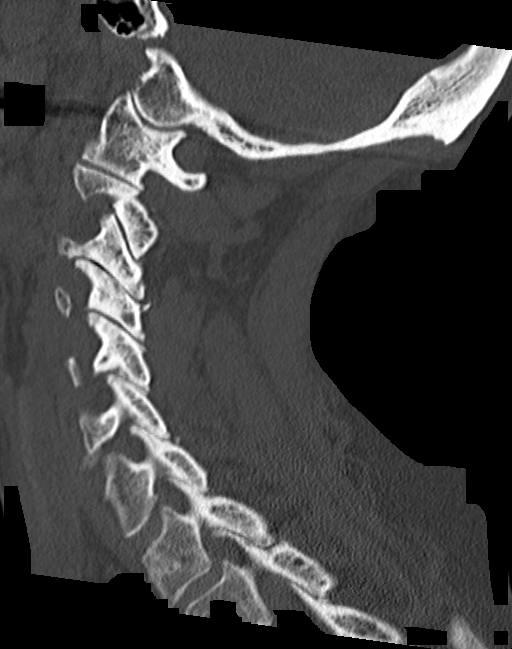
[im 25/60  bone]
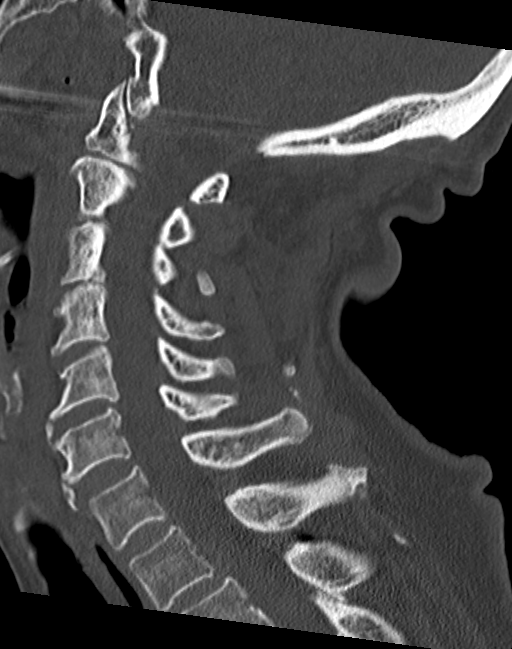
[im 30/60  soft-tissue]
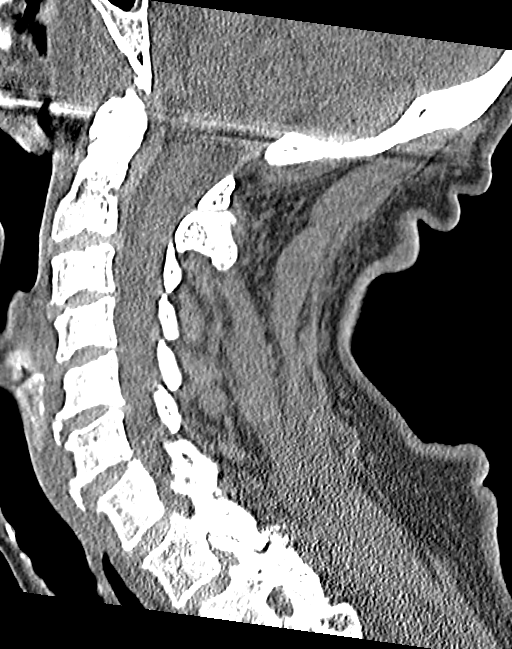
[im 30/60  bone]
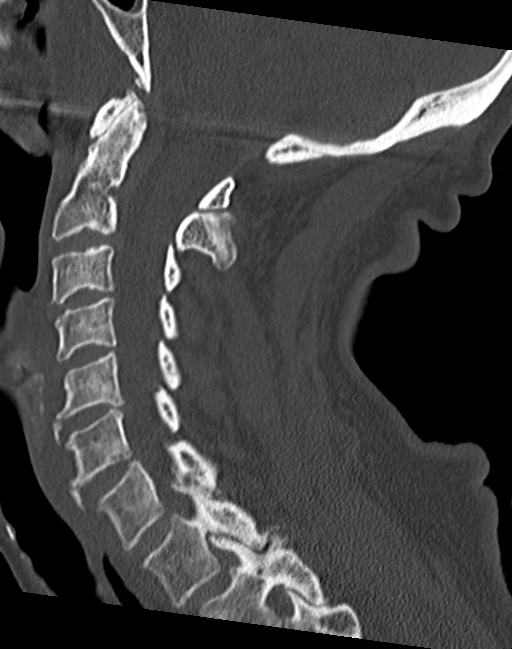
[im 35/60  bone]
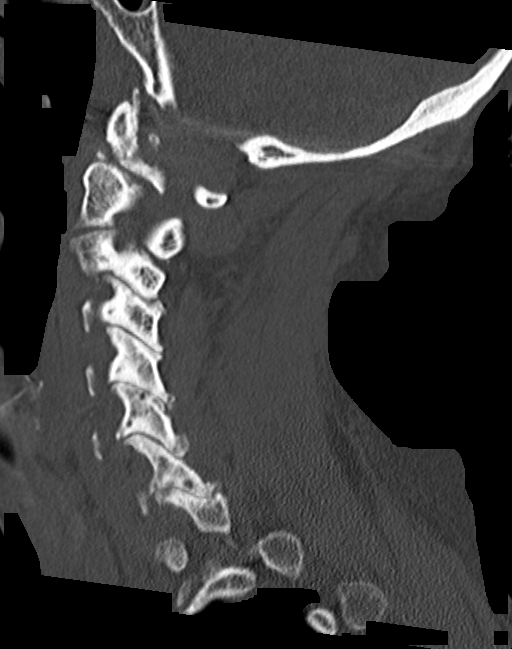
[im 40/60  bone]
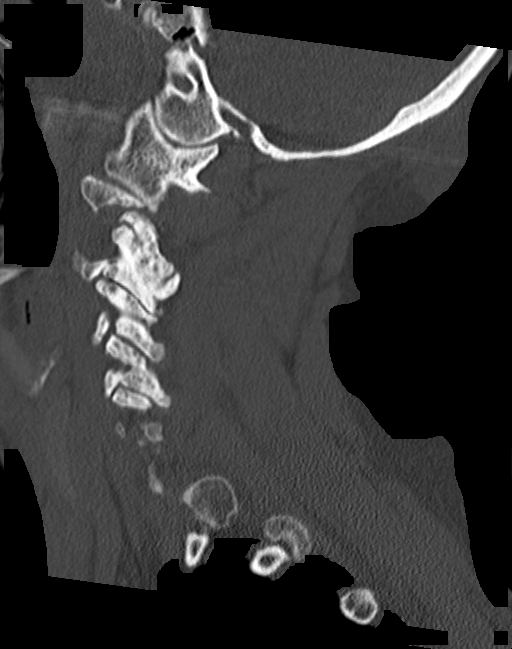

[Series 8: coronal bone 2.0 · coronal · 0.34mm/px · 3 of 59 slices shown]
[im 12/59  bone]
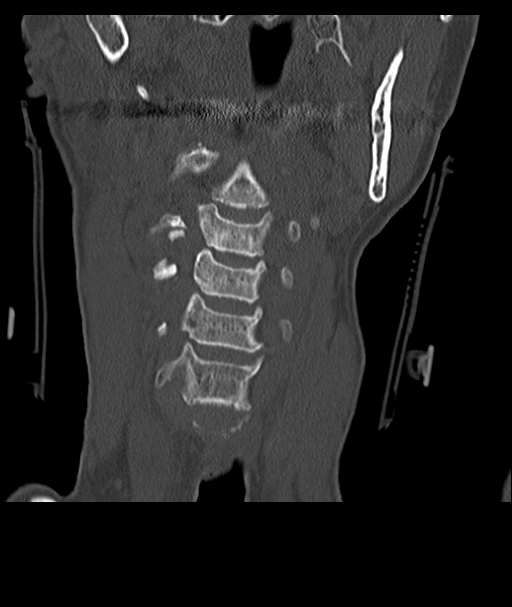
[im 24/59  bone]
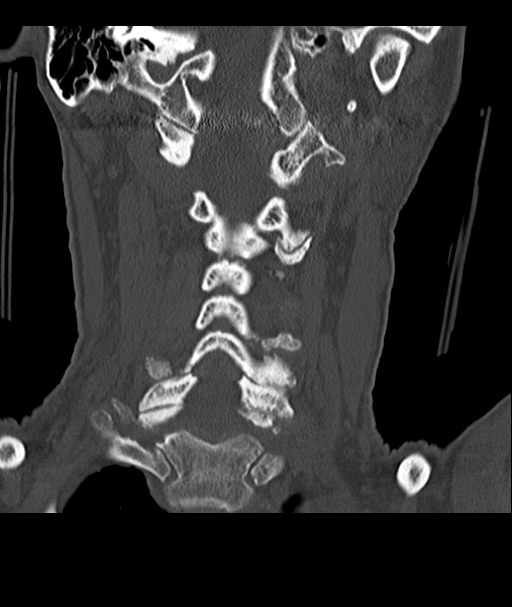
[im 35/59  bone]
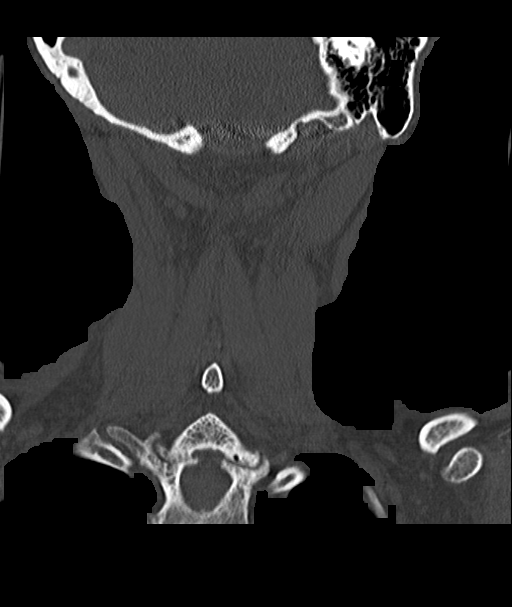

[Series 9: axial bone 2.0 · axial · 0.24mm/px · z∈[+4,+71]mm · 3 of 87 slices shown]
[im 18/87  bone]
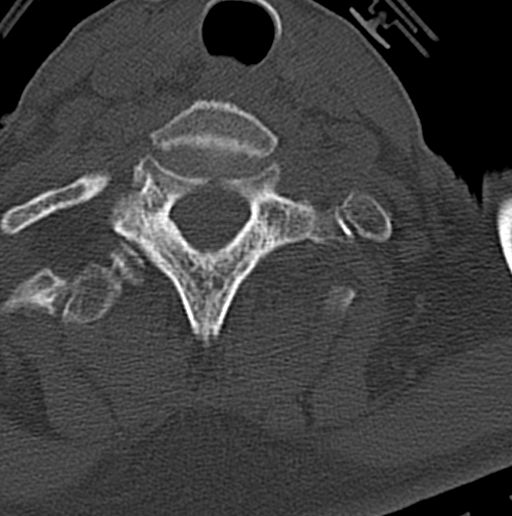
[im 35/87  bone]
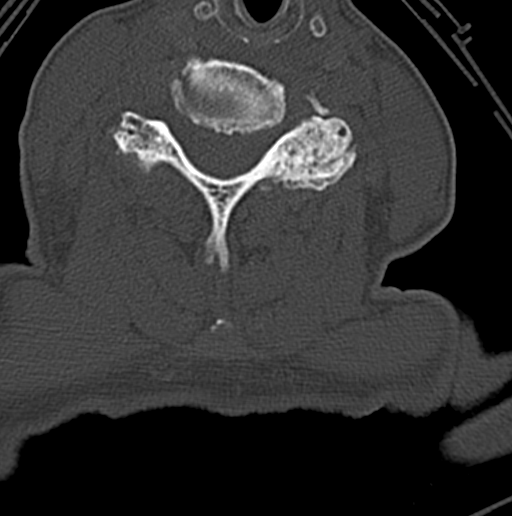
[im 52/87  bone]
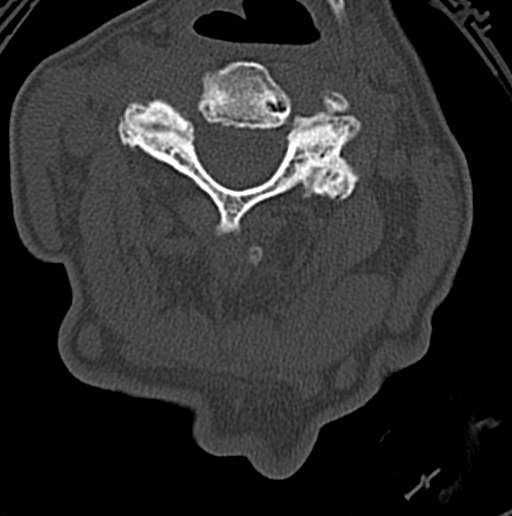

[15 of 33 positions shown; findings below may reference images not displayed]

FINDINGS: CT HEAD FINDINGS

No skull fracture is noted. Nodular subcutaneous soft tissue density
left occipital region subcutaneous axial image 8 measures 1.3 cm
stable in size in appearance from prior exam.

Stable atrophy and chronic white matter disease. No intracranial
hemorrhage, mass effect or midline shift. No acute cortical
infarction. Punctate calcification in left basal ganglia again
noted. No mass lesion is noted on this unenhanced scan. Mild mucosal
thickening bilateral sphenoid sinus.

CT CERVICAL SPINE FINDINGS

Axial images of the cervical spine shows no acute fracture or
subluxation. Computer processed images shows no acute fracture or
subluxation. Degenerative changes are noted C1-C2 articulation. Mild
disc space flattening noted at C5-C6 level. Anterior osteophytes
noted lower endplate of C5 and C6 vertebral bodies. Facet
degenerative changes noted T1 level.

No prevertebral soft tissue swelling. No pneumothorax in visualized
lung apices. Cervical airway is patent.
IMPRESSION: 1. No acute intracranial abnormality. Stable atrophy and chronic
white matter disease.
2. Stable subcutaneous soft tissue nodules in left occipital region.
3. No cervical spine acute fracture or subluxation. Degenerative
changes as described above.

## 2024-01-26 ENCOUNTER — Other Ambulatory Visit: Payer: Self-pay

## 2024-01-26 ENCOUNTER — Emergency Department (HOSPITAL_COMMUNITY): Payer: Medicare (Managed Care)

## 2024-01-26 ENCOUNTER — Emergency Department (HOSPITAL_COMMUNITY)
Admission: EM | Admit: 2024-01-26 | Discharge: 2024-01-26 | Disposition: A | Discharge status: Home / Self Care | Payer: Medicare (Managed Care) | Attending: Emergency Medicine | Admitting: Emergency Medicine

## 2024-01-26 ENCOUNTER — Encounter (HOSPITAL_COMMUNITY): Payer: Self-pay | Admitting: *Deleted

## 2024-01-26 DIAGNOSIS — R4182 Altered mental status, unspecified: Secondary | ICD-10-CM | POA: Diagnosis not present

## 2024-01-26 DIAGNOSIS — W19XXXA Unspecified fall, initial encounter: Secondary | ICD-10-CM | POA: Diagnosis not present

## 2024-01-26 DIAGNOSIS — S0003XA Contusion of scalp, initial encounter: Secondary | ICD-10-CM | POA: Diagnosis present

## 2024-01-26 DIAGNOSIS — I509 Heart failure, unspecified: Secondary | ICD-10-CM | POA: Diagnosis not present

## 2024-01-26 LAB — CBC WITH DIFFERENTIAL/PLATELET
Abs Immature Granulocytes: 0.03 K/uL (ref 0.00–0.07)
Basophils Absolute: 0 K/uL (ref 0.0–0.1)
Basophils Relative: 0 %
Eosinophils Absolute: 0 K/uL (ref 0.0–0.5)
Eosinophils Relative: 0 %
HCT: 36.7 % (ref 36.0–46.0)
Hemoglobin: 11.7 g/dL — ABNORMAL LOW (ref 12.0–15.0)
Immature Granulocytes: 0 %
Lymphocytes Relative: 13 %
Lymphs Abs: 1.4 K/uL (ref 0.7–4.0)
MCH: 31.5 pg (ref 26.0–34.0)
MCHC: 31.9 g/dL (ref 30.0–36.0)
MCV: 98.9 fL (ref 80.0–100.0)
Monocytes Absolute: 0.6 K/uL (ref 0.1–1.0)
Monocytes Relative: 6 %
Neutro Abs: 8.1 K/uL — ABNORMAL HIGH (ref 1.7–7.7)
Neutrophils Relative %: 81 %
Platelets: 230 K/uL (ref 150–400)
RBC: 3.71 MIL/uL — ABNORMAL LOW (ref 3.87–5.11)
RDW: 13.4 % (ref 11.5–15.5)
WBC: 10.2 K/uL (ref 4.0–10.5)
nRBC: 0 % (ref 0.0–0.2)

## 2024-01-26 LAB — COMPREHENSIVE METABOLIC PANEL WITH GFR
ALT: 27 U/L (ref 0–44)
AST: 40 U/L (ref 15–41)
Albumin: 3.4 g/dL — ABNORMAL LOW (ref 3.5–5.0)
Alkaline Phosphatase: 72 U/L (ref 38–126)
Anion gap: 12 (ref 5–15)
BUN: 40 mg/dL — ABNORMAL HIGH (ref 8–23)
CO2: 25 mmol/L (ref 22–32)
Calcium: 9 mg/dL (ref 8.9–10.3)
Chloride: 104 mmol/L (ref 98–111)
Creatinine, Ser: 1.77 mg/dL — ABNORMAL HIGH (ref 0.44–1.00)
GFR, Estimated: 27 mL/min — ABNORMAL LOW (ref >60)
Glucose, Bld: 122 mg/dL — ABNORMAL HIGH (ref 70–99)
Potassium: 4 mmol/L (ref 3.5–5.1)
Sodium: 141 mmol/L (ref 135–145)
Total Bilirubin: 0.5 mg/dL (ref 0.0–1.2)
Total Protein: 6.5 g/dL (ref 6.5–8.1)

## 2024-01-26 NOTE — ED Provider Notes (Signed)
 [place] EMERGENCY DEPARTMENT AT [facility] Provider Note   CSN: [id number] Arrival date & time: 01/26/24  1423     Patient presents with: Altered Mental Status   Paula Norton is a 89 y.o. female.   Patient is a 89 year old female who presents to the emergency department secondary to 3 falls over the past 24 hours.  Facility notes that she is currently being treated for CHF and bronchitis and they are working through these complications at the facility.  She was sent to the ER today for head CT secondary to the fall to rule out intracranial hemorrhage.  Facility notes that they will continue other workup there.  On arrival patient has no acute complaints.  She does complain of some swelling of the left side of her head.  She denies any pain to her neck or back at this time.  She denies any long bone or joint pain.  There is been no chest pain or abdominal pain.   Altered Mental Status Associated symptoms: headaches        Prior to Admission medications   Medication Sig Start Date End Date Taking? Authorizing Provider  acetaminophen (TYLENOL) 650 MG CR tablet Take 650 mg by mouth in the morning, at noon, and at bedtime.   Yes [provider]  allopurinol  (ZYLOPRIM ) 100 MG tablet Take 100 mg by mouth daily. 12/13/12  Yes [provider]  amoxicillin-clavulanate (AUGMENTIN) 875-125 MG tablet Take 1 tablet by mouth every 12 (twelve) hours. 01/25/24 02/01/24 Yes [provider]  ascorbic acid (VITAMIN C) 500 MG tablet Take 500 mg by mouth daily.   Yes [provider]  cholecalciferol (VITAMIN D3) 25 MCG (1000 UNIT) tablet Take 1,000 Units by mouth daily.   Yes [provider]  doxycycline (MONODOX) 100 MG capsule Take 100 mg by mouth 2 (two) times daily. 01/25/24 02/01/24 Yes [provider]  ferrous sulfate 325 (65 FE) MG tablet Take 325 mg by mouth daily with breakfast.   Yes [provider]  furosemide (LASIX) 20  MG tablet Take 20 mg by mouth daily. For 7 days 01/19/24 01/26/24 Yes [provider]  furosemide (LASIX) 40 MG tablet Take 40 mg by mouth daily. For 5 days 01/26/24 01/31/24 Yes [provider]  guaiFENesin (MUCINEX) 600 MG 12 hr tablet Take 600 mg by mouth 2 (two) times daily. Give with 8 oz of fluid. 01/25/24 02/01/24 Yes [provider]  levothyroxine  (SYNTHROID , LEVOTHROID) 75 MCG tablet Take 75 mcg by mouth daily.  12/19/12  Yes [provider]  LORazepam  (ATIVAN ) 0.5 MG tablet Take 0.5 mg by mouth every 8 (eight) hours as needed (severe anxiety). 01/24/24 01/31/24 Yes [provider]  losartan (COZAAR) 50 MG tablet Take 50 mg by mouth 2 (two) times daily. 01/19/24  Yes [provider]  melatonin 3 MG TABS tablet Take 3 mg by mouth at bedtime.   Yes [provider]  mirtazapine (REMERON) 7.5 MG tablet Take 7.5 mg by mouth at bedtime. 01/24/24  Yes [provider]  Nutritional Supplement LIQD Take 237 mLs by mouth in the morning, at noon, and at bedtime.   Yes [provider]  Allie Gutta (OYSTER CALCIUM) 500 MG TABS tablet Take 500 mg of elemental calcium by mouth 2 (two) times daily.   Yes [provider]  Potassium Chloride  ER 20 MEQ TBCR Take 1 tablet by mouth daily. 01/26/24  Yes [provider]  traMADol (ULTRAM) 50 MG tablet  Take 50 mg by mouth every 6 (six) hours as needed (left shoulder pain). 01/26/24  Yes [provider]  vitamin B-12 (CYANOCOBALAMIN) 500 MCG tablet Take 500 mcg by mouth daily.   Yes [provider]    Allergies: Niacin    Review of Systems  Neurological:  Positive for headaches.  All other systems reviewed and are negative.   Updated Vital Signs BP (!) 157/83   Pulse 90   Temp 98.4 F (36.9 C) (Oral)   Resp 12   SpO2 100%   Physical Exam Vitals and nursing note reviewed.  Constitutional:      Appearance: Normal appearance.  HENT:     Head:  Normocephalic and atraumatic.     Nose: Nose normal.     Mouth/Throat:     Mouth: Mucous membranes are moist.  Eyes:     Extraocular Movements: Extraocular movements intact.     Conjunctiva/sclera: Conjunctivae normal.     Pupils: Pupils are equal, round, and reactive to light.  Cardiovascular:     Rate and Rhythm: Normal rate and regular rhythm.     Pulses: Normal pulses.     Heart sounds: Normal heart sounds. No murmur heard.    No gallop.  Pulmonary:     Effort: Pulmonary effort is normal. No respiratory distress.     Breath sounds: Normal breath sounds. No stridor. No wheezing, rhonchi or rales.  Chest:     Chest wall: No tenderness.  Abdominal:     General: Abdomen is flat. Bowel sounds are normal. There is no distension.     Palpations: Abdomen is soft.     Tenderness: There is no abdominal tenderness. There is no guarding.  Musculoskeletal:        General: No swelling, tenderness, deformity or signs of injury. Normal range of motion.     Cervical back: Normal range of motion and neck supple. No rigidity or tenderness.  Skin:    General: Skin is warm and dry.     Findings: No rash.     Comments: Hematoma noted over the left parietal region  Neurological:     General: No focal deficit present.     Mental Status: She is alert and oriented to person, place, and time. Mental status is at baseline.     Cranial Nerves: No cranial nerve deficit.     Sensory: No sensory deficit.  Psychiatric:        Mood and Affect: Mood normal.        Behavior: Behavior normal.        Thought Content: Thought content normal.        Judgment: Judgment normal.     (all labs ordered are listed, but only abnormal results are displayed) Labs Reviewed  COMPREHENSIVE METABOLIC PANEL WITH GFR - Abnormal; Notable for the following components:      Result Value   Glucose, Bld 122 (*)    BUN 40 (*)    Creatinine, Ser 1.77 (*)    Albumin 3.4 (*)    GFR, Estimated 27 (*)    All other components  within normal limits  CBC WITH DIFFERENTIAL/PLATELET - Abnormal; Notable for the following components:   RBC 3.71 (*)    Hemoglobin 11.7 (*)    Neutro Abs 8.1 (*)    All other components within normal limits    EKG: None  Radiology: CT Chest Wo Contrast Result Date: 01/26/2024 CLINICAL DATA:  Fall.  Concern for pneumothorax. EXAM: CT CHEST WITHOUT  CONTRAST TECHNIQUE: Multidetector CT imaging of the chest was performed following the standard protocol without IV contrast. RADIATION DOSE REDUCTION: This exam was performed according to the departmental dose-optimization program which includes automated exposure control, adjustment of the mA and/or kV according to patient size and/or use of iterative reconstruction technique. COMPARISON:  Chest radiograph same day FINDINGS: Cardiovascular: Atherosclerotic calcification of the aorta. No acute findings of the vessels on noncontrast exam. No pericardial fluid Mediastinum/Nodes: No axillary or supraclavicular adenopathy. No mediastinal or hilar adenopathy. No pericardial fluid. Esophagus normal. Lungs/Pleura: No pneumothorax. No pulmonary contusion. No pleural fluid. No suspicious pulmonary nodules. Upper Abdomen: Limited view of the liver, kidneys, pancreas are unremarkable. Normal adrenal glands. Musculoskeletal: Degenerative osteophytosis of the spine. No rib fracture identified. IMPRESSION: 1. No pneumothorax.  No rib fracture. 2. Extensive osteophytosis of the thoracic spine. Electronically Signed   By: Jackquline Boxer M.D.   On: 01/26/2024 16:18   CT Cervical Spine Wo Contrast Result Date: 01/26/2024 CLINICAL DATA:  Provided history: Ataxia, cervical trauma EXAM: CT CERVICAL SPINE WITHOUT CONTRAST TECHNIQUE: Multidetector CT imaging of the cervical spine was performed without intravenous contrast. Multiplanar CT image reconstructions were also generated. RADIATION DOSE REDUCTION: This exam was performed according to the departmental dose-optimization  program which includes automated exposure control, adjustment of the mA and/or kV according to patient size and/or use of iterative reconstruction technique. COMPARISON:  Remote cervical spine CT 05/22/2014 FINDINGS: Alignment: Exaggerated cervical lordosis. No traumatic subluxation. Degenerative grade 1 anterolisthesis of C7 on T1. Skull base and vertebrae: There are no acute or suspicious osseous abnormalities. Soft tissues and spinal canal: No prevertebral fluid or swelling. No visible canal hematoma. Disc levels: Advanced facet hypertrophy with moderate diffuse degenerative disc disease. Calcified posterior disc osteophyte complex at C5-C6 in conjunction with facet hypertrophy causes spinal canal stenosis. Upper chest: No acute findings. Other: None. IMPRESSION: 1. No acute fracture or traumatic subluxation of the cervical spine. 2. Advanced multilevel degenerative disc disease and facet hypertrophy. Electronically Signed   By: Andrea Gasman M.D.   On: 01/26/2024 16:08   CT Head Wo Contrast Result Date: 01/26/2024 CLINICAL DATA:  Head trauma, minor (Age >= 65y) EXAM: CT HEAD WITHOUT CONTRAST TECHNIQUE: Contiguous axial images were obtained from the base of the skull through the vertex without intravenous contrast. RADIATION DOSE REDUCTION: This exam was performed according to the departmental dose-optimization program which includes automated exposure control, adjustment of the mA and/or kV according to patient size and/or use of iterative reconstruction technique. COMPARISON:  Remote head CT 07/23/2013 FINDINGS: Brain: Mild motion artifact limitations. Age related atrophy. No intracranial hemorrhage, mass effect, or midline shift. No hydrocephalus. The basilar cisterns are patent. Basal gangliar mineralization, chronic likely senescent. Mild chronic small vessel ischemia. No evidence of territorial infarct or acute ischemia. No extra-axial or intracranial fluid collection. Vascular: Atherosclerosis of  skullbase vasculature without hyperdense vessel or abnormal calcification. Skull: No fracture or focal lesion. Sinuses/Orbits: No acute finding. Other: Minimal left supraorbital scalp hematoma. IMPRESSION: 1. Minimal left supraorbital scalp hematoma. No acute intracranial abnormality. No skull fracture. 2. Age related atrophy and chronic small vessel ischemia. Electronically Signed   By: Andrea Gasman M.D.   On: 01/26/2024 16:02   DG Chest Port 1 View Result Date: 01/26/2024 CLINICAL DATA:  Pain after fall EXAM: PORTABLE CHEST 1 VIEW COMPARISON:  Chest x-ray 07/23/2013. FINDINGS: Enlarged cardiopericardial silhouette with some vascular congestion. Question component of edema. No pleural effusion or consolidation. Calcified aorta. Overlapping cardiac leads. There is  some lucency along the upper right hemithorax. A subtle pneumothorax versus skin fold is possible. There is also some lucency along the right fifth rib. Please correlate for location of pain. A CT scan may be useful to exclude subtle pneumothorax and rib fracture. Degenerative changes along the spine. Advanced changes of the shoulders. Elevated humeral head regions as well, possible rotator cuff tears. Critical Value/emergent results were called by telephone at the time of interpretation on 01/26/2024 at 3:50 pm to provider Dr. Freddi, who verbally acknowledged these results. IMPRESSION: Enlarged heart with vascular congestion. Question interstitial edema. Lucency along the upper right hemithorax could be a skin fold versus a subtle pneumothorax. In addition there is a question of a rib fracture. Recommend further workup with CT to confirm when clinically appropriate. Critical Value/emergent results were called by telephone at the time of interpretation on 01/26/2024 at 3:50 pm to provider Suheily Birks , who verbally acknowledged these results. Electronically Signed   By: Ranell Bring M.D.   On: 01/26/2024 16:00   DG Pelvis 1-2 Views Result  Date: 01/26/2024 CLINICAL DATA:  Pain after fall EXAM: PELVIS - 1 VIEW COMPARISON:  X-ray 10/14/2009 FINDINGS: Osteopenia. Diffuse skeletal hyperostosis. Concentric joint space loss of the hips bilaterally left slightly worse than right. Degenerative changes also seen of the sacroiliac joints. No fracture or dislocation. With this level of osteopenia, subtle nondisplaced injury is difficult to completely exclude, and if needed additional workup with cross-sectional imaging as clinically appropriate for further sensitivity. IMPRESSION: Osteopenia.  Multifocal degenerative changes. Electronically Signed   By: Ranell Bring M.D.   On: 01/26/2024 15:51     Procedures   Medications Ordered in the ED - No data to display  Clinical Course as of 01/26/24 1725  Wed Jan 26, 2024  1622 DG Pelvis 1-2 Views [CR]    Clinical Course User Index [CR] Daralene Lonni BIRCH, PA-C                                 Medical Decision Making Patient is doing well at this time and is stable for discharge home.  Initial chest x-ray was concerning for possible pneumothorax so CT scan of the chest was obtained.  This demonstrated no indication for pneumothorax or rib fracture.  CT scan of the head demonstrated no indication of intracranial hemorrhage and CT scan of the neck demonstrated no vertebral fracture.  She has no concerning neurological deficits at this time and is mentating at her baseline.  Blood work has overall been unremarkable in the emergency department.  She was nontender to palpation over her chest wall and abdomen.  There was no tenderness over cervical, thoracic, lumbar spine.  Do not suspect any further advanced imaging is warranted at this time.  Will return patient back to her long-term care facility.  This was discussed with the family member at the bedside.  Strict turn precautions were provided for any new or worsening symptoms.  Family voiced understanding and had no additional questions.  Amount  and/or Complexity of Data Reviewed Labs: ordered. Radiology: ordered. Decision-making details documented in ED Course.        Final diagnoses:  None    ED Discharge Orders     None          Daralene Lonni BIRCH DEVONNA 01/26/24 1738    Freddi Hamilton, MD 01/26/24 2245

## 2024-01-26 NOTE — ED Notes (Signed)
 Phone call attempted to [facility] w/o answer.

## 2024-01-26 NOTE — ED Notes (Signed)
 Pt taken to bathroom via WC with NT

## 2024-01-26 NOTE — ED Triage Notes (Signed)
 Pt BIB [facility] from [facility] for c/o AMS; staff reports to ems that pt can usually hold a conversation but is unable to for the last 3 days and over the last 24 hours pt has had 3 falls; pt has dried blood to left nare  Pt is alert and oriented x 2  Pt c/o pain all over

## 2024-01-26 NOTE — ED Notes (Signed)
 Pt found walking around edge of bed in room with brief around her feet. Pt assisted back to bed. New Brief applied. Fall socks placed on pt. Pt with dementia from [facility] after a fall awaiting convo back.. Pt moved to hallway bed in front of nurses' station for better visualization.

## 2024-01-26 NOTE — ED Notes (Signed)
 The facility called and stated pt has recently been diagnosed with CHF and bronchitis and has been getting lasix and neb treatments; they sent her over to have a CT of her head  Pt was given one liter of fluid today

## 2024-01-26 NOTE — Discharge Instructions (Addendum)
 Please follow-up closely with primary care doctor on an outpatient basis.  Return to emergency department immediately for any new or worsening symptoms.
